# Patient Record
Sex: Female | Born: 1960 | Race: White | Hispanic: No | Marital: Single | Smoking: Current every day smoker
Health system: Southern US, Community
[De-identification: ages and names within clinical notes are randomized; demographics above are authoritative.]

## PROBLEM LIST (undated history)

## (undated) DIAGNOSIS — F32A Depression, unspecified: Secondary | ICD-10-CM

## (undated) DIAGNOSIS — E119 Type 2 diabetes mellitus without complications: Secondary | ICD-10-CM

## (undated) DIAGNOSIS — B1920 Unspecified viral hepatitis C without hepatic coma: Secondary | ICD-10-CM

## (undated) DIAGNOSIS — K219 Gastro-esophageal reflux disease without esophagitis: Secondary | ICD-10-CM

## (undated) DIAGNOSIS — F419 Anxiety disorder, unspecified: Secondary | ICD-10-CM

## (undated) DIAGNOSIS — H269 Unspecified cataract: Secondary | ICD-10-CM

## (undated) DIAGNOSIS — M1990 Unspecified osteoarthritis, unspecified site: Secondary | ICD-10-CM

## (undated) DIAGNOSIS — G8929 Other chronic pain: Secondary | ICD-10-CM

## (undated) DIAGNOSIS — I639 Cerebral infarction, unspecified: Secondary | ICD-10-CM

## (undated) DIAGNOSIS — G629 Polyneuropathy, unspecified: Secondary | ICD-10-CM

## (undated) DIAGNOSIS — F329 Major depressive disorder, single episode, unspecified: Secondary | ICD-10-CM

## (undated) HISTORY — DX: Gastro-esophageal reflux disease without esophagitis: K21.9

## (undated) HISTORY — PX: KNEE ARTHROSCOPY W/ ACL RECONSTRUCTION: SHX1858

## (undated) HISTORY — DX: Type 2 diabetes mellitus without complications: E11.9

## (undated) HISTORY — DX: Anxiety disorder, unspecified: F41.9

## (undated) HISTORY — DX: Polyneuropathy, unspecified: G62.9

## (undated) HISTORY — DX: Unspecified osteoarthritis, unspecified site: M19.90

## (undated) HISTORY — DX: Major depressive disorder, single episode, unspecified: F32.9

## (undated) HISTORY — DX: Cerebral infarction, unspecified: I63.9

## (undated) HISTORY — DX: Unspecified viral hepatitis C without hepatic coma: B19.20

## (undated) HISTORY — DX: Depression, unspecified: F32.A

## (undated) HISTORY — PX: TUBAL LIGATION: SHX77

## (undated) HISTORY — DX: Unspecified cataract: H26.9

---

## 1998-03-08 ENCOUNTER — Encounter: Payer: Self-pay | Admitting: Emergency Medicine

## 1998-03-08 ENCOUNTER — Emergency Department (HOSPITAL_COMMUNITY): Admission: EM | Admit: 1998-03-08 | Discharge: 1998-03-08 | Payer: Self-pay | Admitting: Emergency Medicine

## 1998-04-07 ENCOUNTER — Encounter: Admission: RE | Admit: 1998-04-07 | Discharge: 1998-07-06 | Payer: Self-pay | Admitting: Orthopedic Surgery

## 1998-04-10 ENCOUNTER — Other Ambulatory Visit: Admission: RE | Admit: 1998-04-10 | Discharge: 1998-04-10 | Payer: Self-pay | Admitting: *Deleted

## 1998-05-01 ENCOUNTER — Encounter: Admission: RE | Admit: 1998-05-01 | Discharge: 1998-05-01 | Payer: Self-pay | Admitting: Family Medicine

## 1998-05-02 ENCOUNTER — Encounter: Admission: RE | Admit: 1998-05-02 | Discharge: 1998-05-02 | Payer: Self-pay | Admitting: Family Medicine

## 1998-05-29 ENCOUNTER — Encounter: Admission: RE | Admit: 1998-05-29 | Discharge: 1998-05-29 | Payer: Self-pay | Admitting: Family Medicine

## 1998-08-14 ENCOUNTER — Emergency Department (HOSPITAL_COMMUNITY): Admission: EM | Admit: 1998-08-14 | Discharge: 1998-08-14 | Payer: Self-pay | Admitting: Emergency Medicine

## 1998-08-14 ENCOUNTER — Encounter: Payer: Self-pay | Admitting: Emergency Medicine

## 1998-10-15 ENCOUNTER — Emergency Department (HOSPITAL_COMMUNITY): Admission: EM | Admit: 1998-10-15 | Discharge: 1998-10-15 | Payer: Self-pay | Admitting: Emergency Medicine

## 2005-07-28 ENCOUNTER — Emergency Department (HOSPITAL_COMMUNITY): Admission: EM | Admit: 2005-07-28 | Discharge: 2005-07-28 | Payer: Self-pay | Admitting: Emergency Medicine

## 2005-08-25 ENCOUNTER — Ambulatory Visit (HOSPITAL_COMMUNITY): Admission: RE | Admit: 2005-08-25 | Discharge: 2005-08-25 | Payer: Self-pay | Admitting: Orthopedic Surgery

## 2005-10-04 ENCOUNTER — Emergency Department (HOSPITAL_COMMUNITY): Admission: EM | Admit: 2005-10-04 | Discharge: 2005-10-05 | Payer: Self-pay | Admitting: Emergency Medicine

## 2005-12-21 ENCOUNTER — Ambulatory Visit: Payer: Self-pay | Admitting: Gastroenterology

## 2006-01-31 ENCOUNTER — Encounter (INDEPENDENT_AMBULATORY_CARE_PROVIDER_SITE_OTHER): Payer: Self-pay | Admitting: *Deleted

## 2006-01-31 ENCOUNTER — Ambulatory Visit (HOSPITAL_COMMUNITY): Admission: RE | Admit: 2006-01-31 | Discharge: 2006-01-31 | Payer: Self-pay | Admitting: Gastroenterology

## 2006-03-03 ENCOUNTER — Ambulatory Visit: Payer: Self-pay | Admitting: Gastroenterology

## 2006-07-22 ENCOUNTER — Encounter (INDEPENDENT_AMBULATORY_CARE_PROVIDER_SITE_OTHER): Payer: Self-pay | Admitting: Specialist

## 2006-07-22 ENCOUNTER — Ambulatory Visit (HOSPITAL_COMMUNITY): Admission: RE | Admit: 2006-07-22 | Discharge: 2006-07-22 | Payer: Self-pay | Admitting: Gastroenterology

## 2006-08-05 ENCOUNTER — Encounter: Admission: RE | Admit: 2006-08-05 | Discharge: 2006-08-05 | Payer: Self-pay | Admitting: Gastroenterology

## 2006-08-10 ENCOUNTER — Emergency Department (HOSPITAL_COMMUNITY): Admission: EM | Admit: 2006-08-10 | Discharge: 2006-08-10 | Payer: Self-pay | Admitting: Emergency Medicine

## 2007-09-14 IMAGING — CR DG KNEE COMPLETE 4+V*L*
4 series · 4 of 4 positions shown · non-contrast
Comparison: None.

CLINICAL DATA: Knee pain.  
 LEFT KNEE - 4 VIEW:

[view not recorded (1 of 4)]
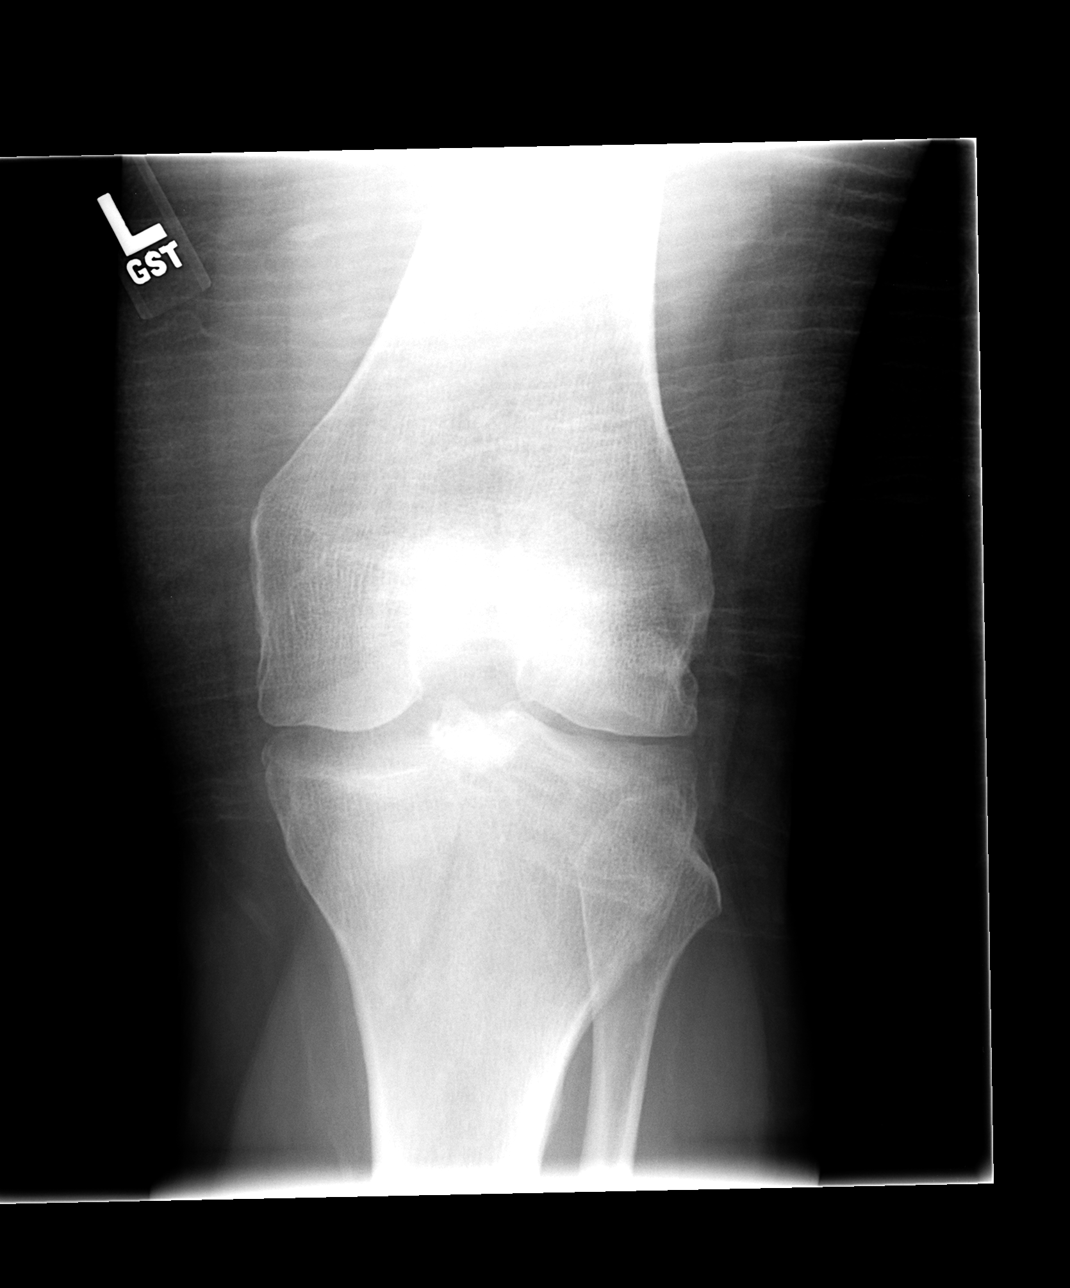

[view not recorded (2 of 4)]
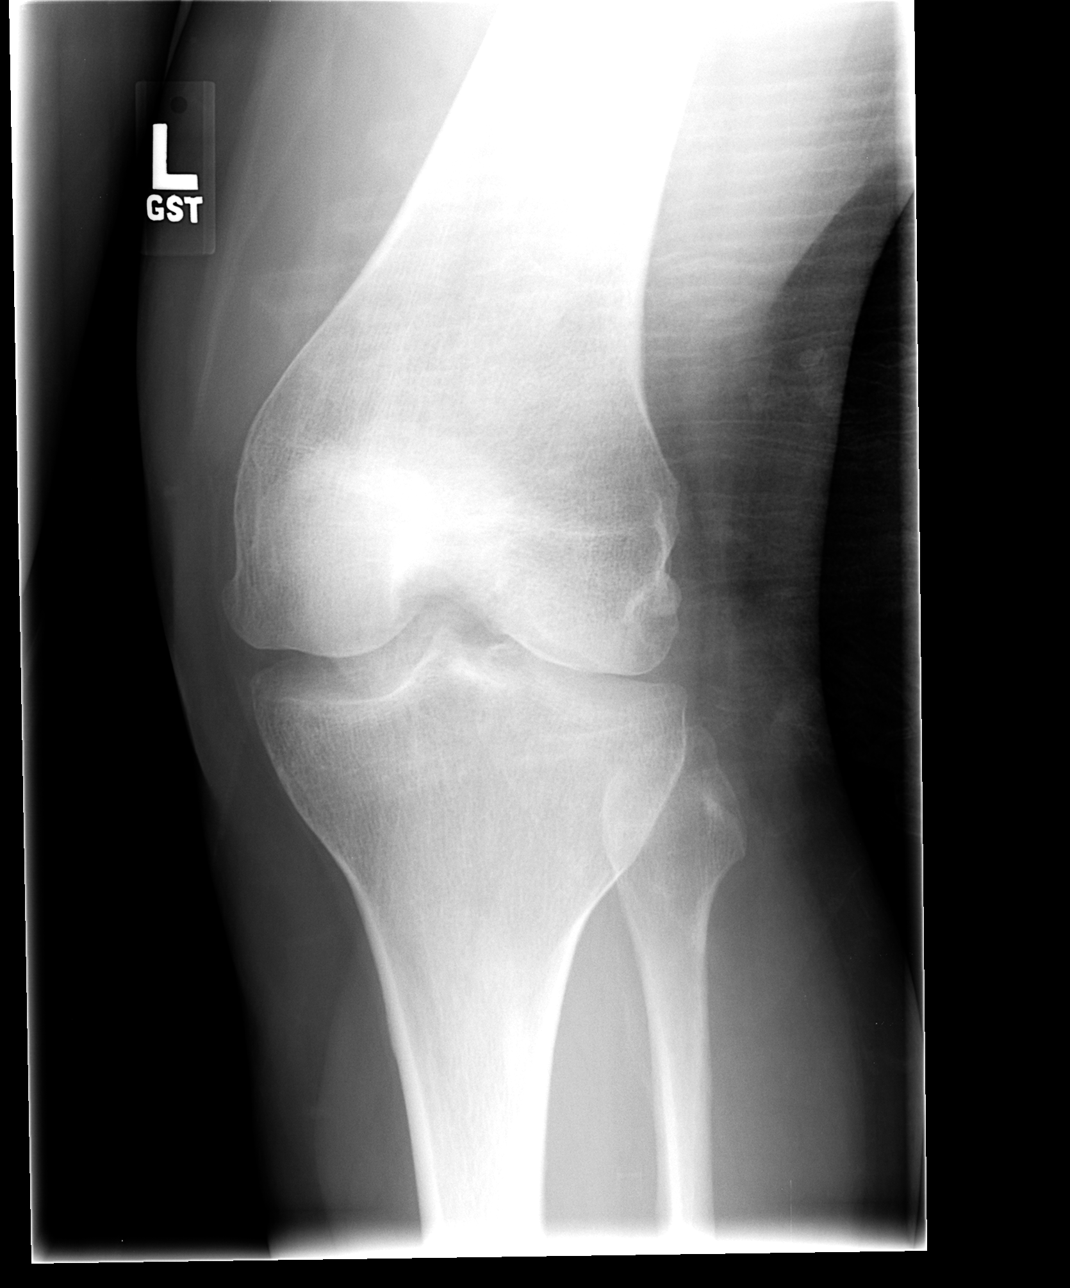

[view not recorded (3 of 4)]
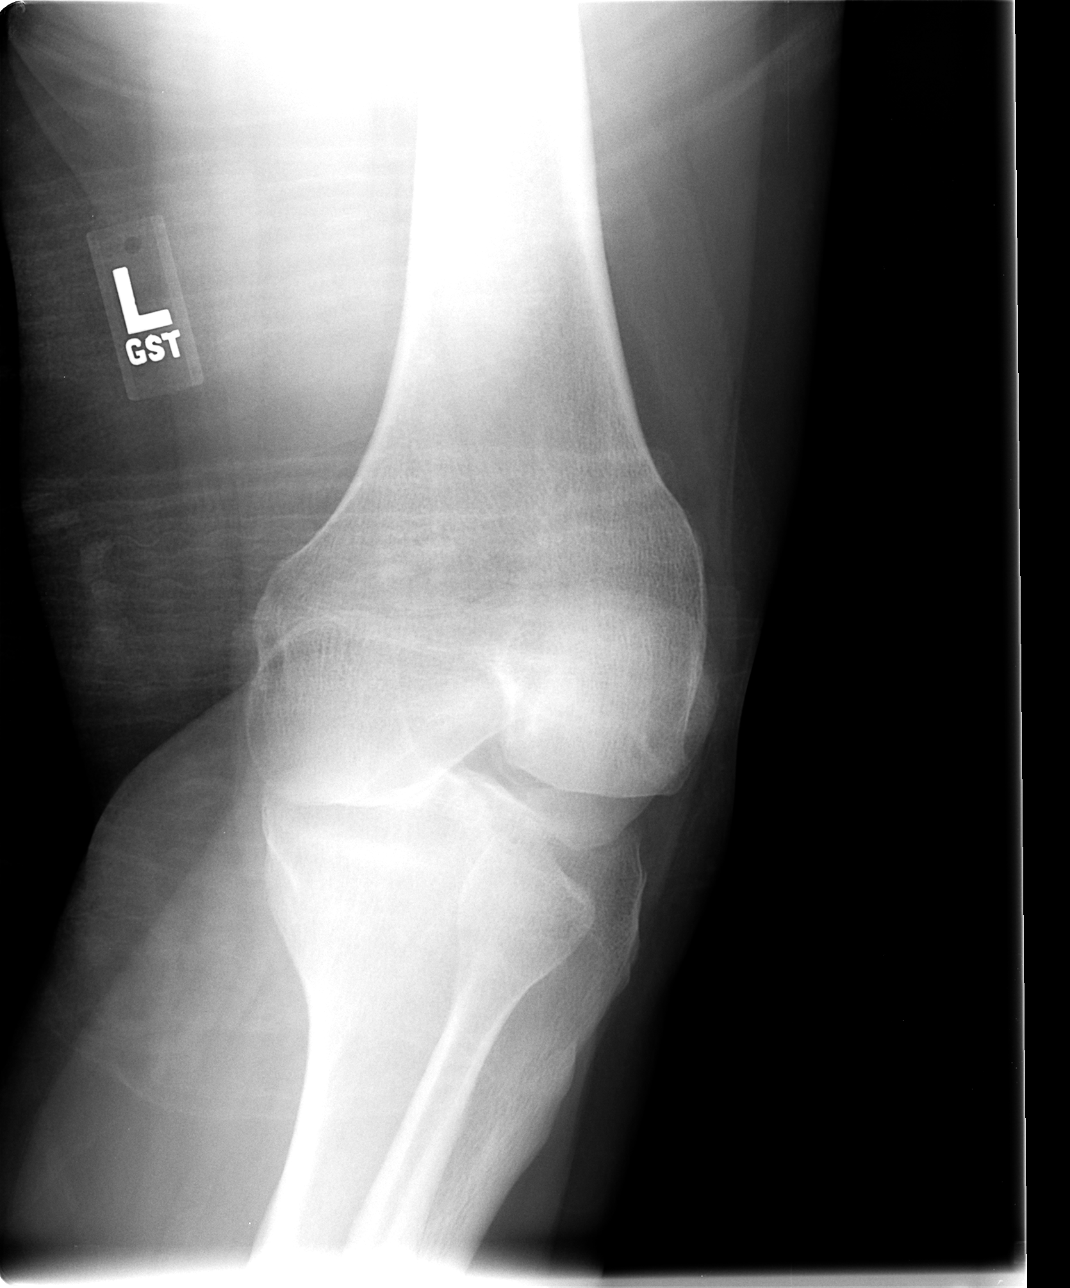

[view not recorded (4 of 4)]
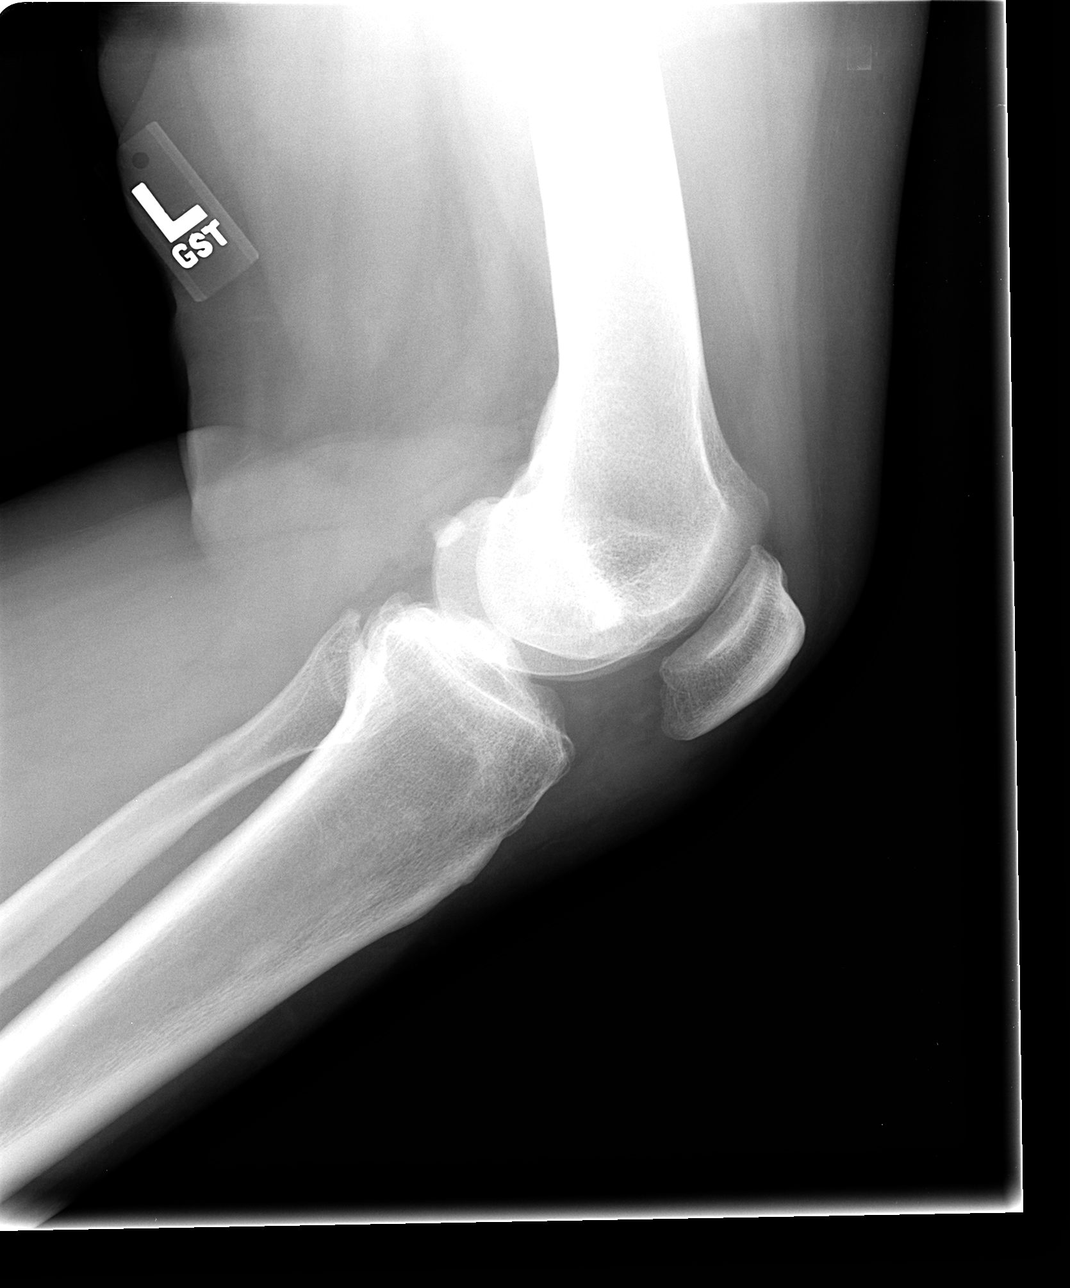

[4 of 4 positions shown; findings below may reference images not displayed]

FINDINGS: No joint effusion.  Quadriceps and patellar tendons are intact.  
 There are mild degenerative-type changes but no fractures or dislocations.
IMPRESSION: 1.  No acute findings. 
 2.  Mild degenerative changes.

## 2008-01-09 ENCOUNTER — Encounter: Admission: RE | Admit: 2008-01-09 | Discharge: 2008-03-05 | Payer: Self-pay | Admitting: Occupational Therapy

## 2009-08-24 ENCOUNTER — Emergency Department (HOSPITAL_COMMUNITY): Admission: EM | Admit: 2009-08-24 | Discharge: 2009-08-24 | Payer: Self-pay | Admitting: Emergency Medicine

## 2009-10-17 ENCOUNTER — Emergency Department (HOSPITAL_COMMUNITY): Admission: EM | Admit: 2009-10-17 | Discharge: 2009-10-18 | Payer: Self-pay | Admitting: Emergency Medicine

## 2010-09-01 LAB — POCT PREGNANCY, URINE: Preg Test, Ur: NEGATIVE

## 2010-09-01 LAB — POCT I-STAT, CHEM 8
BUN: 17 mg/dL (ref 6–23)
Calcium, Ion: 1.04 mmol/L — ABNORMAL LOW (ref 1.12–1.32)
Chloride: 98 mEq/L (ref 96–112)
Creatinine, Ser: 0.8 mg/dL (ref 0.4–1.2)
HCT: 48 % — ABNORMAL HIGH (ref 36.0–46.0)
Hemoglobin: 16.3 g/dL — ABNORMAL HIGH (ref 12.0–15.0)
Potassium: 2.7 mEq/L — CL (ref 3.5–5.1)
Sodium: 137 mEq/L (ref 135–145)
TCO2: 31 mmol/L (ref 0–100)

## 2010-09-01 LAB — URINALYSIS, ROUTINE W REFLEX MICROSCOPIC
Ketones, ur: NEGATIVE mg/dL
Protein, ur: NEGATIVE mg/dL
Urobilinogen, UA: 0.2 mg/dL (ref 0.0–1.0)

## 2010-10-30 NOTE — Op Note (Signed)
NAME:  Sara, Banks NO.:  [id number]   MEDICAL RECORD NO.:  [id number]          PATIENT TYPE:  AMB   LOCATION:  ENDO                         FACILITY:  [facility]   PHYSICIAN:  Anselmo Rod, M.D.  DATE OF BIRTH:  01/01/1961   DATE OF PROCEDURE:  07/22/2006  DATE OF DISCHARGE:  07/22/2006                               OPERATIVE REPORT   PROCEDURE PERFORMED:  Colonoscopy with snare polypectomy x1, cold  biopsies x4.   ENDOSCOPIST:  Anselmo Rod, M.D.   INSTRUMENT USED:  Pentax video colonoscope.   INDICATIONS FOR PROCEDURE:  A 50 year old female with a history of  change in bowel habits undergoing a colonoscopy to rule out colonic  polyps, masses, etcetera.   PREPROCEDURE PREPARATION:  Informed consent was procured from the  patient.  The patient was fasted for 4 hours prior to the procedure and  prepped with 20 OsmoPrep pills the night of, and 12 OsmoPrep pills the  morning of the procedure.  The risks and benefits of the procedure  including a 10% missed rate of cancer and polyp were discussed with the  patient as well.   PREPROCEDURE PHYSICAL:  The patient with stable vital signs.  NECK:  Supple.  CHEST:  Clear to auscultation.  Normal S1-S2.  ABDOMEN:  Soft  with normal bowel sounds.   DESCRIPTION OF PROCEDURE:  The patient was placed in the left lateral  decubitus position and sedated with 100 mcg of fentanyl and 10 mg of  Versed given intravenously in slow incremental doses.  Once the patient  was adequately sedated and maintained on low-flow oxygen and continuous  cardiac monitoring, the Pentax video colonoscope was advanced from the  rectum to the cecum.  A small sessile polyp was snared from the  rectosigmoid colon.  Four other polyps were biopsied from the left  colon; one from 50 cm, and three from the rectosigmoid colon.  There was  a large amount of residual stool in the colon.  Multiple washes were  done.  The patient has a somewhat  redundant colon. The patient's  position was changed from the left lateral supine and the right lateral  position with gentle application of abdominal pressure to reach the  cecal base.  Very small lesions could be missed as the prep was  relatively poor.  Retroflexion in the rectum revealed no abnormalities.  The patient tolerated the procedure well without complications.   IMPRESSION:  1. Multiple polyps removed from the left colon; and rectosigmoid colon      (see description above).  2. Significant amount of residual stool in the colon.  Multiple washes      done.  3. No evidence of diverticulosis.  4. No abnormalities noted on retroflexion of the rectum.   RECOMMENDATIONS:  1. Await pathology results.  2. Avoid all nonsteroidals including aspirin for the next 2 weeks.  3. Repeat colonoscopy depending on pathology results.  4. Continue a high fiber diet with liberal fluid intake.  5. Use Colace as need for problems with constipation and MiraLax if      the Colace  does not help.  6. Outpatient follow up in the next 2 weeks for further      recommendations.      Anselmo Rod, M.D.  Electronically Signed     JNM/MEDQ  D:  07/23/2006  T:  07/24/2006  Job:  [id number]   cc:   Lenoard Aden, M.D.

## 2011-06-02 ENCOUNTER — Encounter: Payer: Self-pay | Admitting: Gastroenterology

## 2011-06-21 ENCOUNTER — Encounter: Payer: Self-pay | Admitting: Gastroenterology

## 2011-06-21 ENCOUNTER — Ambulatory Visit (AMBULATORY_SURGERY_CENTER): Payer: BC Managed Care – PPO | Admitting: *Deleted

## 2011-06-21 VITALS — Ht 66.0 in | Wt 216.0 lb

## 2011-06-21 DIAGNOSIS — Z1211 Encounter for screening for malignant neoplasm of colon: Secondary | ICD-10-CM

## 2011-06-21 MED ORDER — PEG-KCL-NACL-NASULF-NA ASC-C 100 G PO SOLR: ORAL | Status: DC

## 2011-06-28 ENCOUNTER — Encounter: Payer: Self-pay | Admitting: Gastroenterology

## 2011-06-28 ENCOUNTER — Ambulatory Visit (AMBULATORY_SURGERY_CENTER): Payer: BC Managed Care – PPO | Admitting: Gastroenterology

## 2011-06-28 VITALS — BP 128/76 | HR 52 | Temp 96.8°F | Resp 13 | Ht 66.0 in | Wt 216.0 lb

## 2011-06-28 DIAGNOSIS — D126 Benign neoplasm of colon, unspecified: Secondary | ICD-10-CM

## 2011-06-28 DIAGNOSIS — D129 Benign neoplasm of anus and anal canal: Secondary | ICD-10-CM

## 2011-06-28 DIAGNOSIS — D128 Benign neoplasm of rectum: Secondary | ICD-10-CM

## 2011-06-28 DIAGNOSIS — Z1211 Encounter for screening for malignant neoplasm of colon: Secondary | ICD-10-CM

## 2011-06-28 MED ORDER — SODIUM CHLORIDE 0.9 % IV SOLN: 500.0000 mL | INTRAVENOUS | Status: DC

## 2011-06-28 NOTE — Op Note (Signed)
[facility] [address] [place], [place]  [address]  CLONOSCOPY PROCEDURE REPORT PATIENT:  Sara Banks, Sara Banks  MR#:  [id number] BIRTHDATE:  21-May-1961, 50 yrs. old  GENDER:  female ENDOSCOPIST:  Rachael Fee, MD REF. BY:  Jackie Plum, M.D. PROCEDURE DATE:  06/28/2011 PROCEDURE:  Colonoscopy with biopsy ASA CLASS:  Class II INDICATIONS:  "15 polyps removed in [place] 10 years ago." We don't have procedure report or path report from this test. Colonoscopy 07/2006 Dr. Charna Elizabeth found hyperplastic polyps only MEDICATIONS:   Fentanyl 75 mcg IV, These medications were titrated to patient response per physician's verbal order, Versed 7 mg IV  DESCRIPTION OF PROCEDURE:   After the risks benefits and alternatives of the procedure were thoroughly explained, informed consent was obtained.  Digital rectal exam was performed and revealed no rectal masses.   The LB [id number]-[id number] endoscope was introduced through the anus and advanced to the cecum, which was identified by both the appendix and ileocecal valve, without limitations.  The quality of the prep was good..  The instrument was then slowly withdrawn as the colon was fully examined. <<PROCEDUREIMAGES>> FINDINGS:  There were several small scattered hyperplastic appearing rectosigmoid polyps. These were sampled with biopsy and sent to pathology (jar 1) (see image3 and image4).  This was otherwise a normal examination of the colon (see image1, image2, and image5).   Retroflexed views in the rectum revealed no abnormalities. COMPLICATIONS:  None  ENDOSCOPIC IMPRESSION: 1) Hyperplastic appearing rectosigmoid polyps, biopsied 2) Otherwise normal examination  RECOMMENDATIONS: 1) If the polyp(s) removed today are proven to be adenomatous (pre-cancerous) polyps, you will need a repeat colonoscopy in 5 years. 2) You will receive a letter within 1-2 weeks with the results of your biopsy as well as final  recommendations. Please call my office if you have not received a letter after 3 weeks. 3) Dr. Christella Hartigan' office will work with you to track down records from your colonoscopy in [facility] 10 years ago (pathology and procedure report) in order to decide proper timing for your next colonoscopy.  _____________________________ Rachael Fee, MD  n. eSIGNED:   Rachael Fee at 06/28/2011 02:42 PM  Jaci Standard, [phone]

## 2011-06-28 NOTE — Patient Instructions (Addendum)
One of your biggest health concerns is your smoking.  This increases your risk for most cancers and serious cardiovascular diseases such as strokes, heart attacks.  You should try your best to stop.  If you need assistance, please contact your PCP or Smoking Cessation Class at [facility] [phone]) or [facility] Quit-Line (1-800-QUIT-NOW). FOLLOW DISCHARGE INSTRUCTIONS (BLUE AND GREEN SHEETS).

## 2011-06-28 NOTE — Progress Notes (Signed)
Patient did not have preoperative order for IV antibiotic SSI prophylaxis. (G8918)  Patient did not experience any of the following events: a burn prior to discharge; a fall within the facility; wrong site/side/patient/procedure/implant event; or a hospital transfer or hospital admission upon discharge from the facility. (G8907)  

## 2011-06-29 ENCOUNTER — Telehealth: Payer: Self-pay | Admitting: *Deleted

## 2011-06-29 ENCOUNTER — Telehealth: Payer: Self-pay

## 2011-06-29 NOTE — Telephone Encounter (Signed)

## 2011-06-29 NOTE — Telephone Encounter (Signed)
Dr Christella Hartigan I called the pt and she has no idea who or where she had her colon done.  What is the next step?

## 2011-06-30 ENCOUNTER — Other Ambulatory Visit: Payer: Self-pay | Admitting: Gastroenterology

## 2011-06-30 NOTE — Telephone Encounter (Signed)
We'll wait to see what pathology is from her colonoscopy this week.  IF hyperplastic, then next colonoscoyp in 10 years (will have only had hyperplastic polyps on pathology reports)

## 2011-07-02 ENCOUNTER — Encounter: Payer: Self-pay | Admitting: Gastroenterology

## 2012-06-14 HISTORY — PX: TRANSTHORACIC ECHOCARDIOGRAM: SHX275

## 2012-09-04 ENCOUNTER — Ambulatory Visit (INDEPENDENT_AMBULATORY_CARE_PROVIDER_SITE_OTHER): Payer: BC Managed Care – PPO | Admitting: Family Medicine

## 2012-09-04 VITALS — BP 128/84 | HR 86 | Temp 98.0°F | Resp 16 | Ht 66.0 in | Wt 201.0 lb

## 2012-09-04 DIAGNOSIS — N898 Other specified noninflammatory disorders of vagina: Secondary | ICD-10-CM

## 2012-09-04 DIAGNOSIS — Z113 Encounter for screening for infections with a predominantly sexual mode of transmission: Secondary | ICD-10-CM

## 2012-09-04 LAB — POCT WET PREP WITH KOH
Clue Cells Wet Prep HPF POC: NEGATIVE
RBC Wet Prep HPF POC: NEGATIVE
Trichomonas, UA: NEGATIVE
WBC Wet Prep HPF POC: NEGATIVE

## 2012-09-04 NOTE — Patient Instructions (Addendum)
We will have the rest of your labs back in the next day or two- I will call you with these results.  Let us know if there is anything else we can do to help!

## 2012-09-04 NOTE — Progress Notes (Signed)
[facility] [address], [place] [address] [phone]  Date:  09/04/2012   Name:  Sara Banks   DOB:  [birth year]   MRN:  [id number]  PCP:  Jackie Plum, MD    Chief Complaint: Exposure to STD   History of Present Illness:  Sara Banks is a 52 y.o. very pleasant female patient who presents with the following:  She is concerned about exposure to STI- she is afraid her husband has been unfaithful.  She last had intercourse with her husband about one month ago, prior to her concerns about his fidelity.  She has noted some watery discharge from her vagina, some odor "like body odor".  No pain.  No dysuria.   She is peri- menopausal.  Her last period was about 4 or 5 months ago.    There is no problem list on file for this patient.   Past Medical History  Diagnosis Date  . Arthritis   . Anxiety   . Depression     Past Surgical History  Procedure Laterality Date  . Knee arthroscopy w/ acl reconstruction      right knee x 3    History  Substance Use Topics  . Smoking status: Current Every Day Smoker -- 0.50 packs/day    Types: Cigarettes  . Smokeless tobacco: Never Used  . Alcohol Use: No     Comment: rarely once a year wine    Family History  Problem Relation Age of Onset  . Colon polyps Father     No Known Allergies  Medication list has been reviewed and updated.  Current Outpatient Prescriptions on File Prior to Visit  Medication Sig Dispense Refill  . citalopram (CELEXA) 10 MG tablet Take 1 tablet by mouth at bedtime.      Marland Kitchen etodolac (LODINE XL) 400 MG 24 hr tablet Take 1 tablet by mouth Daily.      . Melatonin 5 MG CAPS Take 1 capsule by mouth at bedtime as needed.         No current facility-administered medications on file prior to visit.    Review of Systems:  As per HPI- otherwise negative.   Physical Examination: Filed Vitals:   09/04/12 0921  BP: 128/84  Pulse: 86  Temp: 98 F (36.7 C)  Resp: 16    Filed Vitals:   09/04/12 0921  Height: 5\' 6"  (1.676 m)  Weight: 201 lb (91.173 kg)   Body mass index is 32.46 kg/(m^2). Ideal Body Weight: Weight in (lb) to have BMI = 25: 154.6  GEN: WDWN, NAD, Non-toxic, A & O x 3, obese HEENT: Atraumatic, Normocephalic. Neck supple. No masses, No LAD. Ears and Nose: No external deformity. CV: RRR, No M/G/R. No JVD. No thrill. No extra heart sounds. PULM: CTA B, no wheezes, crackles, rhonchi. No retractions. No resp. distress. No accessory muscle use. ABD: S, NT, ND, +BS. No rebound. No HSM. EXTR: No c/c/e NEURO Normal gait.  PSYCH: Normally interactive. Conversant. Not depressed or anxious appearing.  Calm demeanor.  GU: no abnormality noted, no discharge or odor, no CMT or adnexal tenderness or masses  Results for orders placed in visit on 09/04/12  POCT WET PREP WITH KOH      Result Value Range   Trichomonas, UA Negative     Clue Cells Wet Prep HPF POC neg     Epithelial Wet Prep HPF POC 1-3     Yeast Wet Prep HPF POC neg  Bacteria Wet Prep HPF POC 1+     RBC Wet Prep HPF POC neg     WBC Wet Prep HPF POC neg     KOH Prep POC Negative      Assessment and Plan: Routine screening for STI (sexually transmitted infection) - Plan: POCT Wet Prep with KOH, GC/chlamydia probe amp, genital, HIV antibody, Hepatitis B surface antigen, Hepatitis C antibody, RPR, HSV(herpes simplex vrs) 1+2 ab-IgG  Vaginal discharge - Plan: POCT Wet Prep with KOH, GC/chlamydia probe amp, genital  Possible STI exposure.  Will be in touch with her regarding the rest of her labs as soon as they are available.  Wet prep negative  Signed Abbe Amsterdam, MD

## 2012-09-06 ENCOUNTER — Telehealth: Payer: Self-pay | Admitting: Family Medicine

## 2012-09-06 DIAGNOSIS — Z1159 Encounter for screening for other viral diseases: Secondary | ICD-10-CM

## 2012-09-06 NOTE — Telephone Encounter (Signed)
Called and spoke with her- her Hep C screen is positive, she needs the confirmatory test.  She will come in for a lab draw as soon as she can.  If positive will send to hepatology.  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  She asked me NOT to send a copy of her labs to her home, but she will come in for her labs.  She is concerned about the copay needed at lab draw- we will work with her if needed with an account agreement

## 2012-09-12 ENCOUNTER — Other Ambulatory Visit (INDEPENDENT_AMBULATORY_CARE_PROVIDER_SITE_OTHER): Payer: BC Managed Care – PPO | Admitting: Family Medicine

## 2012-09-12 VITALS — BP 100/60 | HR 63 | Temp 98.5°F | Resp 12 | Ht 66.0 in | Wt 205.0 lb

## 2012-09-12 DIAGNOSIS — Z1159 Encounter for screening for other viral diseases: Secondary | ICD-10-CM

## 2012-09-14 ENCOUNTER — Telehealth: Payer: Self-pay | Admitting: Family Medicine

## 2012-09-14 DIAGNOSIS — B1920 Unspecified viral hepatitis C without hepatic coma: Secondary | ICD-10-CM

## 2012-09-14 NOTE — Telephone Encounter (Signed)
Called to communicate positive Hep C confirmatory lab.  Answered all questions and will refer to hep C clinic.

## 2012-09-21 ENCOUNTER — Telehealth: Payer: Self-pay | Admitting: Family Medicine

## 2012-09-21 DIAGNOSIS — B1920 Unspecified viral hepatitis C without hepatic coma: Secondary | ICD-10-CM

## 2012-09-21 NOTE — Telephone Encounter (Signed)
Called to find out about her hepatitis clinic appt- she has an appt in May.  Apologized for not ordering her LFTs  when she came for confirmatory testing- this should have been done.  Asked her to please come by clinic at her convenience for a lab visit only.  Will ask lab to please waive co-pay for this recollection.

## 2012-09-21 NOTE — Telephone Encounter (Signed)
Message copied by Pearline Cables on Thu Sep 21, 2012  5:13 PM ------      Message from: Sara Banks      Created: Thu Sep 14, 2012  4:29 PM       Unable to add. LFT"s are done on a SST and the HCV C Vrs RNA by PCR Qual is a critical frozen with a lavender top ------

## 2012-10-18 ENCOUNTER — Other Ambulatory Visit: Payer: Self-pay | Admitting: Internal Medicine

## 2012-10-18 DIAGNOSIS — R894 Abnormal immunological findings in specimens from other organs, systems and tissues: Secondary | ICD-10-CM

## 2012-10-23 ENCOUNTER — Ambulatory Visit
Admission: RE | Admit: 2012-10-23 | Discharge: 2012-10-23 | Disposition: A | Discharge status: Home / Self Care | Payer: BC Managed Care – PPO | Source: Ambulatory Visit | Attending: Internal Medicine | Admitting: Internal Medicine

## 2012-10-23 DIAGNOSIS — R894 Abnormal immunological findings in specimens from other organs, systems and tissues: Secondary | ICD-10-CM

## 2012-10-30 ENCOUNTER — Encounter: Payer: Self-pay | Admitting: Family Medicine

## 2013-05-11 ENCOUNTER — Encounter (HOSPITAL_COMMUNITY): Payer: Self-pay | Admitting: Emergency Medicine

## 2013-05-11 ENCOUNTER — Inpatient Hospital Stay (HOSPITAL_COMMUNITY)
Admission: EM | Admit: 2013-05-11 | Discharge: 2013-05-12 | DRG: 066 | Disposition: A | Discharge status: Home / Self Care | Payer: BC Managed Care – PPO | Attending: Family Medicine | Admitting: Family Medicine

## 2013-05-11 ENCOUNTER — Emergency Department (HOSPITAL_COMMUNITY): Payer: BC Managed Care – PPO

## 2013-05-11 ENCOUNTER — Emergency Department (HOSPITAL_COMMUNITY)
Admission: EM | Admit: 2013-05-11 | Discharge: 2013-05-11 | Disposition: A | Discharge status: Other Acute Inpatient Hospital | Payer: BC Managed Care – PPO | Source: Home / Self Care

## 2013-05-11 DIAGNOSIS — R202 Paresthesia of skin: Secondary | ICD-10-CM

## 2013-05-11 DIAGNOSIS — I639 Cerebral infarction, unspecified: Secondary | ICD-10-CM | POA: Diagnosis present

## 2013-05-11 DIAGNOSIS — Z720 Tobacco use: Secondary | ICD-10-CM | POA: Diagnosis present

## 2013-05-11 DIAGNOSIS — I6350 Cerebral infarction due to unspecified occlusion or stenosis of unspecified cerebral artery: Principal | ICD-10-CM | POA: Diagnosis present

## 2013-05-11 DIAGNOSIS — B182 Chronic viral hepatitis C: Secondary | ICD-10-CM | POA: Diagnosis present

## 2013-05-11 DIAGNOSIS — M545 Low back pain, unspecified: Secondary | ICD-10-CM | POA: Diagnosis present

## 2013-05-11 DIAGNOSIS — R079 Chest pain, unspecified: Secondary | ICD-10-CM | POA: Diagnosis present

## 2013-05-11 DIAGNOSIS — E66811 Obesity, class 1: Secondary | ICD-10-CM | POA: Diagnosis present

## 2013-05-11 DIAGNOSIS — M129 Arthropathy, unspecified: Secondary | ICD-10-CM | POA: Diagnosis present

## 2013-05-11 DIAGNOSIS — F17200 Nicotine dependence, unspecified, uncomplicated: Secondary | ICD-10-CM | POA: Diagnosis present

## 2013-05-11 DIAGNOSIS — R42 Dizziness and giddiness: Secondary | ICD-10-CM

## 2013-05-11 DIAGNOSIS — Z823 Family history of stroke: Secondary | ICD-10-CM

## 2013-05-11 DIAGNOSIS — H9319 Tinnitus, unspecified ear: Secondary | ICD-10-CM | POA: Diagnosis present

## 2013-05-11 DIAGNOSIS — R209 Unspecified disturbances of skin sensation: Secondary | ICD-10-CM | POA: Diagnosis present

## 2013-05-11 DIAGNOSIS — G8929 Other chronic pain: Secondary | ICD-10-CM | POA: Diagnosis present

## 2013-05-11 DIAGNOSIS — E669 Obesity, unspecified: Secondary | ICD-10-CM | POA: Diagnosis present

## 2013-05-11 HISTORY — DX: Other chronic pain: G89.29

## 2013-05-11 HISTORY — DX: Cerebral infarction, unspecified: I63.9

## 2013-05-11 LAB — CBC
HCT: 45.3 % (ref 36.0–46.0)
MCH: 30.6 pg (ref 26.0–34.0)
MCH: 31 pg (ref 26.0–34.0)
MCHC: 33.8 g/dL (ref 30.0–36.0)
MCHC: 34 g/dL (ref 30.0–36.0)
MCV: 90.6 fL (ref 78.0–100.0)
MCV: 91.1 fL (ref 78.0–100.0)
Platelets: 363 10*3/uL (ref 150–400)
RDW: 13.7 % (ref 11.5–15.5)
WBC: 6.2 10*3/uL (ref 4.0–10.5)
WBC: 8.8 10*3/uL (ref 4.0–10.5)

## 2013-05-11 LAB — TSH: TSH: 2.778 u[IU]/mL (ref 0.350–4.500)

## 2013-05-11 LAB — TROPONIN I: Troponin I: <0.3 ng/mL (ref <0.30)

## 2013-05-11 LAB — COMPREHENSIVE METABOLIC PANEL
ALT: 15 U/L (ref 0–35)
BUN: 9 mg/dL (ref 6–23)
CO2: 22 mEq/L (ref 19–32)
Calcium: 9 mg/dL (ref 8.4–10.5)
Creatinine, Ser: 0.66 mg/dL (ref 0.50–1.10)
GFR calc Af Amer: >90 mL/min (ref >90)
GFR calc non Af Amer: >90 mL/min (ref >90)
Glucose, Bld: 98 mg/dL (ref 70–99)
Sodium: 135 mEq/L (ref 135–145)
Total Bilirubin: 0.5 mg/dL (ref 0.3–1.2)
Total Protein: 7.2 g/dL (ref 6.0–8.3)

## 2013-05-11 LAB — URINALYSIS, ROUTINE W REFLEX MICROSCOPIC
Bilirubin Urine: NEGATIVE
Glucose, UA: NEGATIVE mg/dL
Ketones, ur: NEGATIVE mg/dL
Leukocytes, UA: NEGATIVE
Protein, ur: NEGATIVE mg/dL
Urobilinogen, UA: 0.2 mg/dL (ref 0.0–1.0)

## 2013-05-11 LAB — RAPID URINE DRUG SCREEN, HOSP PERFORMED
Amphetamines: NOT DETECTED
Barbiturates: NOT DETECTED
Benzodiazepines: NOT DETECTED

## 2013-05-11 LAB — PROTIME-INR
INR: 0.95 (ref 0.00–1.49)
Prothrombin Time: 12.5 seconds (ref 11.6–15.2)

## 2013-05-11 LAB — LIPID PANEL
HDL: 43 mg/dL (ref >39)
Total CHOL/HDL Ratio: 4.5 RATIO
Triglycerides: 222 mg/dL — ABNORMAL HIGH (ref <150)

## 2013-05-11 MED ORDER — GADOBENATE DIMEGLUMINE 529 MG/ML IV SOLN
19.0000 mL | Freq: Once | INTRAVENOUS | Status: AC | PRN
  Administered 2013-05-11: 19 mL via INTRAVENOUS

## 2013-05-11 MED ORDER — GABAPENTIN 100 MG PO CAPS
100.0000 mg | ORAL_CAPSULE | Freq: Two times a day (BID) | ORAL | Status: DC | PRN
  Administered 2013-05-11: 300 mg via ORAL
  Filled 2013-05-11: qty 3

## 2013-05-11 MED ORDER — OXYCODONE HCL 5 MG PO TABS
5.0000 mg | ORAL_TABLET | Freq: Four times a day (QID) | ORAL | Status: DC | PRN
  Administered 2013-05-11 – 2013-05-12 (×2): 5 mg via ORAL
  Filled 2013-05-11 (×2): qty 1

## 2013-05-11 MED ORDER — ASPIRIN 325 MG PO TABS
325.0000 mg | ORAL_TABLET | Freq: Every day | ORAL | Status: DC
  Administered 2013-05-11 – 2013-05-12 (×2): 325 mg via ORAL
  Filled 2013-05-11 (×3): qty 1

## 2013-05-11 MED ORDER — ASPIRIN 300 MG RE SUPP
300.0000 mg | Freq: Every day | RECTAL | Status: DC
  Filled 2013-05-11 (×2): qty 1

## 2013-05-11 MED ORDER — CYCLOBENZAPRINE HCL 10 MG PO TABS
10.0000 mg | ORAL_TABLET | Freq: Three times a day (TID) | ORAL | Status: DC | PRN
  Administered 2013-05-11: 10 mg via ORAL
  Filled 2013-05-11: qty 1

## 2013-05-11 MED ORDER — RIBAVIRIN 200 MG PO CAPS
600.0000 mg | ORAL_CAPSULE | Freq: Two times a day (BID) | ORAL | Status: DC
  Administered 2013-05-11: 600 mg via ORAL
  Filled 2013-05-11 (×3): qty 3

## 2013-05-11 MED ORDER — SOFOSBUVIR 400 MG PO TABS
400.0000 mg | ORAL_TABLET | Freq: Every day | ORAL | Status: DC
  Filled 2013-05-11 (×2): qty 1

## 2013-05-11 MED ORDER — SENNOSIDES-DOCUSATE SODIUM 8.6-50 MG PO TABS: 1.0000 | ORAL_TABLET | Freq: Every evening | ORAL | Status: DC | PRN

## 2013-05-11 MED ORDER — HEPARIN SODIUM (PORCINE) 5000 UNIT/ML IJ SOLN
5000.0000 [IU] | Freq: Three times a day (TID) | INTRAMUSCULAR | Status: DC
  Administered 2013-05-11 – 2013-05-12 (×4): 5000 [IU] via SUBCUTANEOUS
  Filled 2013-05-11 (×6): qty 1

## 2013-05-11 NOTE — ED Notes (Signed)
Pt sent from [facility] for dizziness with nausea and room spinning x 3 days; pt sent here for further eval; pt sts some decreased sensation on left side and has chronic pain on left side; pt ambulatory at present

## 2013-05-11 NOTE — ED Provider Notes (Signed)
Chief Complaint:   Chief Complaint  Patient presents with  . Dizziness    History of Present Illness:   Sara Banks is a 52 year old female with hepatitis C and no prior history of elevated blood pressure, stroke, or elevated cholesterol. She presents with a three-day history of whirling vertigo which is continuous, a full sensation in her left ear, she has trouble focusing her vision is blurry. When she stands she leans to the left and when she walks she is unsteady on her feet and staggers towards her left. She notes her breathing her left ear and diminished hearing in her left ear. She also complains of numbness and tingling of the entire left side of her body from her face on down to her leg. She's had some weakness of her left arm but no facial or leg weakness. This is more prominent yesterday and she doesn't notice it today. She denies headache, fever, chills, diplopia, or difficulty with speech. She's had no prior history of stroke. She smokes half a pack of cigarettes a day.  Review of Systems:  Other than noted above, the patient denies any of the following symptoms: Systemic:  No fever, chills, fatigue, photophobia, stiff neck. Eye:  No redness, eye pain, discharge, blurred vision, or diplopia. ENT:  No nasal congestion, rhinorrhea, sinus pressure or pain, sneezing, earache, or sore throat.  No jaw claudication. Neuro:  No paresthesias, loss of consciousness, seizure activity, muscle weakness, trouble with coordination or gait, trouble speaking or swallowing. Psych:  No depression, anxiety or trouble sleeping.  PMFSH:  Past medical history, family history, social history, meds, and allergies were reviewed.  She has a history of hepatitis C, anxiety, depression, and arthritis. She currently takes Neurontin, ribavirin, cyclobenzaprine, plus another hepatitis C drug.  Physical Exam:   Vital signs:  BP 146/92  Pulse 83  Temp(Src) 98.2 F (36.8 C) (Oral)  Resp 16  SpO2 97% General:   Alert and oriented.  In no distress. Eye:  Lids and conjunctivas normal.  PERRL,  Full EOMs.   ENT:  No cranial or facial tenderness to palpation.  TMs and canals clear.  Nasal mucosa was normal and uncongested without any drainage. No intra oral lesions, pharynx clear, mucous membranes moist, dentition normal. Neck:  Supple, full ROM, no tenderness to palpation.  No adenopathy or mass. No carotid bruit. Lungs: Clear to auscultation. Heart: Regular rhythm, no gallop or murmur. Neuro:  Alert and orented times 3.  Speech was clear, fluent, and appropriate.  Cranial nerves intact. No pronator drift, muscle strength normal. Finger to nose normal.  DTRs were 2+ and symmetrical.  She reports diminished sensation to light touch over the left side of the face, left arm, and left leg. Psych:  Normal affect.  Assessment:  The primary encounter diagnosis was CVA (cerebral infarction). Diagnoses of Paresthesias and Vertigo were also pertinent to this visit.  She has vertigo plus left body paresthesias and subjective left arm weakness although no objective weakness. CVA needs to be ruled out.  Plan:  The patient was transferred to the ED via shuttle in stable condition.  Medical Decision Making: 52 year old female presents with 3 days history of vertigo, left ear congestion, decreased hearing in left ear, ringing in left ear, blurred vision, ataxia, paresthesias of left face, arm and leg and weakness of left only.  On exam she is normal neurologically except for decreased sensation to light touch over entire left side of body.  I think she  may have had a stroke, but onset of symptoms has been days ago, so not a code stroke.  Will send by shuttle.          Reuben Likes, MD 05/11/13 1146

## 2013-05-11 NOTE — ED Provider Notes (Signed)
Medical screening examination/treatment/procedure(s) were performed by non-physician practitioner and as supervising physician I was immediately available for consultation/collaboration.  EKG Interpretation    Date/Time:    Ventricular Rate:    PR Interval:    QRS Duration:   QT Interval:    QTC Calculation:   R Axis:     Text Interpretation:                Layla Maw Maliha Outten, DO 05/11/13 1721

## 2013-05-11 NOTE — ED Provider Notes (Signed)
CSN: [id number]     Arrival date & time 05/11/13  1146 History   First MD Initiated Contact with Patient 05/11/13 1158     Chief Complaint  Patient presents with  . Dizziness  . Nausea   (Consider location/radiation/quality/duration/timing/severity/associated sxs/prior Treatment) Patient is a 52 y.o. female presenting with weakness.  Weakness This is a new problem. The current episode started in the past 7 days. The problem occurs constantly. The problem has been gradually improving. Associated symptoms include nausea, numbness, vertigo, a visual change and weakness. Nothing aggravates the symptoms. She has tried nothing for the symptoms.    URGENT CARE TRANSFER concern for stroke/TIA for 3 days  Pt to the ER with complaints of dizziness for three days. She has had symptoms of difficulty ambulating, left ear fullness/ringing, blurry vision, ataxia - leaning towards the left, left arm and leg  Paresthesias. The symptoms were most prominent yesterday and have resolved significantly, however when the dizziness persisted she went to the UC for further evaluation. Pt says, Dr. Lorenz Coaster is concerned I may have had a small stroke. Pt has PMH significant for Hep C. She is also a half pack per day smoker.      Past Medical History  Diagnosis Date  . Arthritis   . Anxiety   . Depression   . Hepatitis C dx 2014    [facility]  . Chronic pain    Past Surgical History  Procedure Laterality Date  . Knee arthroscopy w/ acl reconstruction      right knee x 3   Family History  Problem Relation Age of Onset  . Colon polyps Father    History  Substance Use Topics  . Smoking status: Current Every Day Smoker -- 0.50 packs/day    Types: Cigarettes  . Smokeless tobacco: Never Used  . Alcohol Use: No     Comment: rarely once a year wine   OB History   Grav Para Term Preterm Abortions TAB SAB Ect Mult Living                 Review of Systems  Gastrointestinal: Positive for nausea.   Neurological: Positive for vertigo, weakness and numbness.   Other than noted above, the patient denies any of the following symptoms:  Systemic: No fever, chills, fatigue, photophobia, stiff neck.  Eye: No redness, eye pain, discharge, blurred vision, or diplopia.  ENT: No nasal congestion, rhinorrhea, sinus pressure or pain, sneezing, earache, or sore throat. No jaw claudication.  Neuro: No paresthesias, loss of consciousness, seizure activity, muscle weakness, trouble with coordination or gait, trouble speaking or swallowing.  Psych: No depression, anxiety or trouble sleeping.  Allergies  Review of patient's allergies indicates no known allergies.  Home Medications   Current Outpatient Rx  Name  Route  Sig  Dispense  Refill  . cyclobenzaprine (FLEXERIL) 10 MG tablet   Oral   Take 10 mg by mouth 3 (three) times daily as needed for muscle spasms.         Marland Kitchen etodolac (LODINE XL) 400 MG 24 hr tablet   Oral   Take 1 tablet by mouth daily as needed (for pain).          Marland Kitchen gabapentin (NEURONTIN) 100 MG capsule   Oral   Take 100-300 mg by mouth 2 (two) times daily as needed (for pain/sleep).          . hydrocodone-ibuprofen (VICOPROFEN) 5-200 MG per tablet   Oral   Take  1 tablet by mouth every 8 (eight) hours as needed for pain.         . ribavirin (REBETOL) 200 MG capsule   Oral   Take 600 mg by mouth 2 (two) times daily.         . Sofosbuvir (SOVALDI) 400 MG TABS   Oral   Take 400 mg by mouth daily.          BP 126/70  Pulse 87  Temp(Src) 98.5 F (36.9 C) (Oral)  Resp 15  SpO2 96% Physical Exam  Nursing note and vitals reviewed. Constitutional: She is oriented to person, place, and time. She appears well-developed and well-nourished. No distress.  HENT:  Head: Normocephalic and atraumatic.  Eyes: Pupils are equal, round, and reactive to light.  Neck: Normal range of motion. Neck supple.  Cardiovascular: Normal rate and regular rhythm.   Pulmonary/Chest:  Effort normal.  Abdominal: Soft.  Neurological: She is alert and oriented to person, place, and time. She has normal strength. A sensory deficit (decreased sensation to light touch of left arm and face. ) is present. No cranial nerve deficit.  Finger to nose normal.    Skin: Skin is warm and dry.    ED Course  Procedures (including critical care time) Labs Review Labs Reviewed  URINE RAPID DRUG SCREEN (HOSP PERFORMED)  CBC  COMPREHENSIVE METABOLIC PANEL  PROTIME-INR  URINALYSIS, ROUTINE W REFLEX MICROSCOPIC   Imaging Review Dg Chest 2 View  05/11/2013   CLINICAL DATA:  Hypertension, dizziness, and nausea  EXAM: CHEST  2 VIEW  COMPARISON:  None.  FINDINGS: The lungs are adequately inflated. There are mildly increased linear interstitial markings in the mid lungs suggesting subsegmental atelectasis. There is no pleural effusion or pneumothorax. The cardiac silhouette is normal in size. The mediastinum is normal in width. There is no pleural effusion. The bony thorax exhibits no acute abnormality.  IMPRESSION: There is bilateral perihilar subsegmental atelectasis. There is no focal pneumonia nor evidence of CHF. If there are clinical findings that might suggest pulmonary embolism, CT angiography of the chest would be of value.   Electronically Signed   By: David  [place]   On: 05/11/2013 13:07   Ct Head Wo Contrast  05/11/2013   CLINICAL DATA:  Dizziness.  Nausea.  EXAM: CT HEAD WITHOUT CONTRAST  TECHNIQUE: Contiguous axial images were obtained from the base of the skull through the vertex without intravenous contrast.  COMPARISON:  CT 08/24/2009.  FINDINGS: No mass. No hydrocephalus. No hemorrhage. No acute bony abnormality identified. Visualized paranasal sinuses and mastoids are clear. Posterior fossa is unremarkable.  IMPRESSION: Negative exam.   Electronically Signed   By: Maisie Fus  Register   On: 05/11/2013 13:09    EKG Interpretation   None       MDM   1. CVA (cerebral infarction)      12:39pm Discussed with patient that I am concerned for stoke as well. Does not meet Code Stroke criteria due to length of symptoms. Discussed with patient the plan and labs/images I plan to order. She understands and is agreeable. She is currently stable.  2;19 pm - labs and images are unremarkable. MRI of brain ordered. Dr. Amada Jupiter to consult. Family Medicine to admit for further evaluation.  Inpatient, admit, family medicine  Dorthula Matas, PA-C 05/11/13 1420

## 2013-05-11 NOTE — ED Notes (Signed)
Pt returned from radiology.

## 2013-05-11 NOTE — Consult Note (Addendum)
Referring Physician: Dr Elesa Massed   Chief Complaint: Dizziness with left-sided neurosensory deficits  HPI: Sara Banks is a 52 y.o. female who developed dizziness on the evening of 05/09/2013. The patient developed symptoms sometime that evening; however, she is unable to say exactly what time this occurred. She also noted a fullness and ringing in her left ear. She states she felt dizzy whether she was lying, sitting, or standing. She also noted some numbness on the left side of her body although she denies weakness. Other symptoms included diaphoresis and chest heaviness. Over the past couple of days her symptoms have improved somewhat but have not resolved. The patient presented today for further evaluation. A CT scan of the head showed no acute changes. There is concern that the symptoms might represent a stroke and a neurology consult has been requested. The patient will be admitted for a full stroke workup including an MRI/MRA.  Date last known well: Date: 05/09/2013 Time last known well: Unable to determine tPA Given: No: - Late presentation - outside the window for treatment.  Past Medical History  Diagnosis Date  . Arthritis   . Anxiety   . Depression   . Hepatitis C dx 2014    [facility]  . Chronic pain     Past Surgical History  Procedure Laterality Date  . Knee arthroscopy w/ acl reconstruction      right knee x 3    Family History  Problem Relation Age of Onset  . Colon polyps Father    Social History:  reports that she has been smoking Cigarettes.  She has been smoking about 0.50 packs per day. She has never used smokeless tobacco. She reports that she does not drink alcohol or use illicit drugs.  Family history: The patient reports that both parents had strokes.  Allergies: No Known Allergies  Medications:  No current facility-administered medications for this encounter.   Current Outpatient Prescriptions  Medication Sig Dispense Refill  .  cyclobenzaprine (FLEXERIL) 10 MG tablet Take 10 mg by mouth 3 (three) times daily as needed for muscle spasms.      Marland Kitchen etodolac (LODINE XL) 400 MG 24 hr tablet Take 1 tablet by mouth daily as needed (for pain).       Marland Kitchen gabapentin (NEURONTIN) 100 MG capsule Take 100-300 mg by mouth 2 (two) times daily as needed (for pain/sleep).       . hydrocodone-ibuprofen (VICOPROFEN) 5-200 MG per tablet Take 1 tablet by mouth every 8 (eight) hours as needed for pain.      . ribavirin (REBETOL) 200 MG capsule Take 600 mg by mouth 2 (two) times daily.      . Sofosbuvir (SOVALDI) 400 MG TABS Take 400 mg by mouth daily.         ROS: History obtained from the patient  General ROS: negative for - chills, fever, night sweats, weight gain or weight loss. Positive for recent fatigue. Psychological ROS: negative for - behavioral disorder, hallucinations, memory difficulties, mood swings or suicidal ideation Ophthalmic ROS: negative for - blurry vision, double vision, eye pain or loss of vision ENT ROS: negative for - epistaxis, nasal discharge, oral lesions, sore throat, tinnitus or vertigo Allergy and Immunology ROS: negative for - hives or itchy/watery eyes Hematological and Lymphatic ROS: negative for - bleeding problems, bruising or swollen lymph nodes Endocrine ROS: negative for - galactorrhea, hair pattern changes, polydipsia/polyuria or temperature intolerance Respiratory ROS: negative for - cough, hemoptysis, shortness of breath or  wheezing Cardiovascular ROS: negative for - chest pain, dyspnea on exertion, edema or irregular heartbeat. Positive for chest heaviness when symptoms first occurred. Positive for diaphoresis with onset of symptoms. Gastrointestinal ROS: negative for - abdominal pain,hematemesis, nausea/vomiting or stool incontinence. Positive for diarrhea recently. Genito-Urinary ROS: negative for - dysuria, hematuria, incontinence or urinary frequency/urgency Musculoskeletal ROS: negative for -  joint swelling or muscular weakness. Previous left knee injury as well as coccyx injury. Neurological ROS: as noted in HPI Dermatological ROS: negative for rash and skin lesion changes   Physical Examination: Blood pressure 134/66, pulse 70, temperature 98.5 F (36.9 C), temperature source Oral, resp. rate 17, SpO2 100.00%.  General - 52 year old female in no acute distress. Heart - Regular rate and rhythm - no murmer Lungs - Clear to auscultation Abdomen - Soft - non tender Extremities - Distal pulses weak to absent - no edema Skin - Warm and dry   Neurologic Examination:  Mental Status: Alert, oriented, thought content appropriate.  Speech fluent without evidence of aphasia.  Able to follow 3 step commands without difficulty. Cranial Nerves: II: Discs not visualized; Visual fields grossly normal, pupils equal, round, reactive to light and accommodation III,IV, VI: ptosis not present, extra-ocular motions intact bilaterally V,VII: smile symmetric, facial light touch sensation normal bilaterally VIII: hearing normal bilaterally IX,X: gag reflex present XI: bilateral shoulder shrug XII: midline tongue extension Motor: Right : Upper extremity   5/5    Left:     Upper extremity   5/5  Lower extremity   5/5     Lower extremity   5/5 Tone and bulk:normal tone throughout; no atrophy noted Sensory: Decreased sensation to light touch left face and left upper extremity. Deep Tendon Reflexes: 2+ and symmetric throughout Plantars: Right: downgoing   Left: downgoing Cerebellar: normal finger-to-nose, normal rapid alternating movements and normal heel-to-shin test Gait: Not tested CV: pulses palpable throughout   She has a postive head thrust test to the right    Laboratory Studies:  Basic Metabolic Panel:  Recent Labs Lab 05/11/13 1232  NA 135  K 4.0  CL 103  CO2 22  GLUCOSE 98  BUN 9  CREATININE 0.66  CALCIUM 9.0    Liver Function Tests:  Recent Labs Lab  05/11/13 1232  AST 19  ALT 15  ALKPHOS 75  BILITOT 0.5  PROT 7.2  ALBUMIN 3.8   No results found for this basename: LIPASE, AMYLASE,  in the last 168 hours No results found for this basename: AMMONIA,  in the last 168 hours  CBC:  Recent Labs Lab 05/11/13 1232  WBC 6.2  HGB 14.6  HCT 43.2  MCV 90.6  PLT 297    Cardiac Enzymes: No results found for this basename: CKTOTAL, CKMB, CKMBINDEX, TROPONINI,  in the last 168 hours  BNP: No components found with this basename: POCBNP,   CBG: No results found for this basename: GLUCAP,  in the last 168 hours  Microbiology: Results for orders placed in visit on 09/04/12  GC/CHLAMYDIA PROBE AMP     Status: None   Collection Time    09/04/12  9:57 AM      Result Value Range Status   CT Probe RNA NEGATIVE   Final   GC Probe RNA NEGATIVE   Final   Comment:  Assay performed using the Gen-Probe APTIMA COMBO2 (R) Assay.           Acceptable specimen types for this assay include APTIMA Swabs (Unisex,     endocervical, urethral, or vaginal), first void urine, and ThinPrep     liquid based cytology samples.    Coagulation Studies:  Recent Labs  05/11/13 1232  LABPROT 12.5  INR 0.95    Urinalysis:  Recent Labs Lab 05/11/13 1328  COLORURINE YELLOW  LABSPEC 1.011  PHURINE 6.5  GLUCOSEU NEGATIVE  HGBUR NEGATIVE  BILIRUBINUR NEGATIVE  KETONESUR NEGATIVE  PROTEINUR NEGATIVE  UROBILINOGEN 0.2  NITRITE NEGATIVE  LEUKOCYTESUR NEGATIVE    Lipid Panel: No results found for this basename: chol, trig, hdl, cholhdl, vldl, ldlcalc    HgbA1C:  No results found for this basename: HGBA1C    Urine Drug Screen:     Component Value Date/Time   LABOPIA NONE DETECTED 05/11/2013 1328   COCAINSCRNUR NONE DETECTED 05/11/2013 1328   LABBENZ NONE DETECTED 05/11/2013 1328   AMPHETMU NONE DETECTED 05/11/2013 1328   THCU NONE  DETECTED 05/11/2013 1328   LABBARB NONE DETECTED 05/11/2013 1328    Alcohol Level: No results found for this basename: ETH,  in the last 168 hours  Other results: EKG: Not found  Imaging:  Dg Chest 2 View 05/11/2013    There is bilateral perihilar subsegmental atelectasis. There is no focal pneumonia nor evidence of CHF. If there are clinical findings that might suggest pulmonary embolism, CT angiography of the chest would be of value.      Ct Head Wo Contrast 05/11/2013    Negative exam.       Assessment: 52 y.o. female with onset of dizziness, ringing and fullness in her left ear, and left hemisensory deficit which came on gradually on the evening of 05/09/2013. Since then her symptoms have improved somewhat; however, they have not resolved. She presented today for further evaluation. A neurology consult was requested to rule out a possible CVA. The patient will be admitted for a full stroke workup.  Stroke Risk Factors - family history, smoking and obesity  Plan: 1. HgbA1c, fasting lipid panel 2. MRI, MRA  of the brain without contrast 3. PT consult, OT consult, Speech consult 4. Echocardiogram 5. Carotid dopplers 6. Prophylactic therapy-Antiplatelet med: Aspirin - dose 325 mg QD 7. Risk factor modification 8. Telemetry monitoring 9. Frequent neuro checks   Hassel Neth [facility] Pager [phone] 05/11/2013, 3:20 PM   I have seen and evaluated the patient. I have reviewed the above note and made appropriate changes. 52 year old female with acute onset of vertigo. Her exam including positive head thrust test and decreased hearing on left side are very suggestive of a peripheral process, but the hypesthesia on the left is very odd. Of note, she had not noticed this symptom until someone brought it to her attention suggesting that it may be an old deficit and confounding the picture here. I would repeat her MRI with thin DWI through the brainstem,  and if negative would treat this as labyrinthitis with a course of steroids.   It is possible that this represents a first attack of meniere's disease, but without multiple attacks, this is not a diagnosis to be made.   Ritta Slot, MD [facility] [phone]  If 7pm- 7am, please page neurology on call at [phone].

## 2013-05-11 NOTE — ED Notes (Signed)
C/o couple of days duration of dizzy, decreased sensation on one side for couple of days; NAD

## 2013-05-11 NOTE — ED Notes (Signed)
Attempted report. RN unavailable.

## 2013-05-11 NOTE — H&P (Signed)
[facility]MTS Attending Admission Note: Sara Pagan, MD I  have seen and examined this patient, reviewed their chart. I have discussed this patient with the resident. I agree with the resident's findings, assessment and care plan.  Briefly patient presented to the ER at her PCP request to r/o stroke for history of dizziness and left sided weakness and paresthesia that started 3 days ago. She denies any hx of fall,no LOC,her symptom was worse yesterday to the extent that she could not stand up by herself.No headache,no vision change,no facial dropping. She stated her symptom has improved a lot when compared to yesterday.  Past Medical History  Diagnosis Date  . Arthritis   . Anxiety   . Depression   . Hepatitis C dx 2014    [facility]  . Chronic pain    Filed Vitals:   05/11/13 1515 05/11/13 1642 05/11/13 1723 05/11/13 1930  BP: 140/71 136/116 140/77 137/86  Pulse: 71 79 85 106  Temp:   98.2 F (36.8 C) 97.9 F (36.6 C)  TempSrc:   Oral Oral  Resp: 14 18 20 18   SpO2: 98% 96% 98% 95%   Exam: Gen: Awake and alert, calm in bed not in distress. Neuro: Normal gait,no facial drooping,++DTR,cranial nerve grossly intact,no sensory loss,strength 5/5 grossly. Resp: Air entry equal B/L CV: S1 S2 normal, RRR,no murmurs. Abd: Benign. Ext: No edema.  A/P: 52 Y/O F with     Dizziness,left extremity weakness: R/O Stroke vs TIA vs vertigo with hx of tinnitus.     CT head done without acute finding.     Recommend stroke work up with MRI,carotid doppler, ECHO.     Neurology consult. PT/OP.     We will follow up with MRI report.       Chronic conditions: Hep C    Continue home regimen.

## 2013-05-11 NOTE — H&P (Signed)
Family Medicine [facility] Admission History and Physical Service Pager: [phone]  Patient name: Sara Banks Medical record number: [id number] Date of birth: Sep 11, 1960 Age: 52 y.o. Gender: female  Primary Care Provider: Jackie Plum, MD Consultants: Neurology Code Status: Full Code  Chief Complaint: Left sided weakness and dizziness  Assessment and Plan: Sara Banks is a 52 y.o. female presenting with left sided weakness and dizziness . PMH is significant for hepatitis C, tobacco use > 20 years, and strong family history of CVA. She is currently not having any symptoms of CVA and is stable.  # Left sided weakness: Patient's history suspicious for TIA vs CVA. Risk factors include strong family history of stroke (unsure if early age), obesity and smoking. Symptoms, for the most part, have resolved. Physical exam is not remarkable for stroke symptoms.  Admit to inpatient  Cardiac monitoring  Follow-up neurology recommendations  MRI/MRA head  Carotid doppler  Echocardiogram  Start aspirin  PT/OT consult  Frequent neuro checks  Risk factor modification: weightloss, smoking cessation, increased exercise  # Chest heaviness: complaint of chest heaviness with associated diaphoresis from conversation with neurology. Patient not currently complaining of chest pain. Could be anxiety vs ACS vs musculoskeletal. Has risk factors for CAD including obesity and smoking.  EKG today, repeat in AM  Cycle troponin x3  Risk stratification: TSH, lipid profile, A1C  # Hepatitis C  Continue home ribavirin and sofosbuvir  #Tobacco abuse: has a 20 pack-year history. Currently smoking 1/2 PPD.  Nicotine patch if patient requests  Encourage smoking cessation, will order counseling   # Chronic back pain  Will not resume home etodalac or hyrocodone-ibuprofen given increased risk of CV event  Continue gabapentin  Start oxycodone 5mg  q6hours PRN for  pain  FEN/GI: Heart healthy (passed bedside swallow); saline lock IV Prophylaxis: heparin subq  Disposition: Admit to inpatient  History of Present Illness: Sara Banks is a 52 y.o. female presenting with dizziness, nausea, constant left ear ringing and fullness, left sided weakness and numbness, and weakness that started two days ago. Her symptoms worsened yesterday, but have subsided today. Her left sided weakness causes her to lean to the left when she walks. She reports no recent falls due to her weakness, but has had to catch herself using a nearby wall. She has not had a history of this in the past. She states she does not take any medications for blood pressure or diabetes. Her ear ringing has been a chronic issue and has occurred for many years.  In the ED, she received a CT scan, which was negative for acute process. Neurology was consulted.  Review Of Systems: Per HPI with the following additions:  Otherwise 12 point review of systems was performed and was unremarkable.  There are no active problems to display for this patient.  Past Medical History: Past Medical History  Diagnosis Date  . Arthritis   . Anxiety   . Depression   . Hepatitis C dx 2014    [facility]  . Chronic pain    Past Surgical History: Past Surgical History  Procedure Laterality Date  . Knee arthroscopy w/ acl reconstruction      right knee x 3   Social History: History  Substance Use Topics  . Smoking status: Current Every Day Smoker -- 0.50 packs/day    Types: Cigarettes  . Smokeless tobacco: Never Used  . Alcohol Use: No     Comment: rarely once a year wine  Additional social history: smoking for   Please also refer to relevant sections of EMR.  Family History: Family History  Problem Relation Age of Onset  . Colon polyps Father    Stroke - mom (unknown age), dad (unknown age), aunts/uncles Prostate cancer - dad Cancer of cerebellum - mom  Allergies and  Medications: No Known Allergies No current facility-administered medications on file prior to encounter.   Current Outpatient Prescriptions on File Prior to Encounter  Medication Sig Dispense Refill  . cyclobenzaprine (FLEXERIL) 10 MG tablet Take 10 mg by mouth 3 (three) times daily as needed for muscle spasms.      Marland Kitchen etodolac (LODINE XL) 400 MG 24 hr tablet Take 1 tablet by mouth daily as needed (for pain).       Marland Kitchen gabapentin (NEURONTIN) 100 MG capsule Take 100-300 mg by mouth 2 (two) times daily as needed (for pain/sleep).         Objective: BP 123/69  Pulse 81  Temp(Src) 98.5 F (36.9 C) (Oral)  Resp 15  SpO2 98%  Exam: General: Obese female laying in stretcher in no acute distress with family at bedside and looks comfortable HEENT: PERRLA, EOMI, TMs non-erythematous and unremarkable, oral cavity moist Cardiovascular: RRR, no murmurs Respiratory: Clear to auscultation bilaterally, no wheezes or crackles Abdomen: Soft, obese, non-tender, no masses palpable Skin: No cyanosis Neuro: Alert and oriented x3; cranial nerves 2-11 intact, UE/LE 5/5 strength bilaterally, normal DTR (brachioradialis, biceps, patellar) bilaterally, Romberg negative, no dysdiadochokinesia, finger-to-nose normal, mildly decreased sensation when touching left foot compared to right.  Labs and Imaging: CBC BMET   Recent Labs Lab 05/11/13 1232  WBC 6.2  HGB 14.6  HCT 43.2  PLT 297    Recent Labs Lab 05/11/13 1232  NA 135  K 4.0  CL 103  CO2 22  BUN 9  CREATININE 0.66  GLUCOSE 98  CALCIUM 9.0     Dg Chest 2 View  05/11/2013   CLINICAL DATA:  Hypertension, dizziness, and nausea  EXAM: CHEST  2 VIEW  COMPARISON:  None.  FINDINGS: The lungs are adequately inflated. There are mildly increased linear interstitial markings in the mid lungs suggesting subsegmental atelectasis. There is no pleural effusion or pneumothorax. The cardiac silhouette is normal in size. The mediastinum is normal in width.  There is no pleural effusion. The bony thorax exhibits no acute abnormality.  IMPRESSION: There is bilateral perihilar subsegmental atelectasis. There is no focal pneumonia nor evidence of CHF. If there are clinical findings that might suggest pulmonary embolism, CT angiography of the chest would be of value.   Electronically Signed   By: David  [place]   On: 05/11/2013 13:07   Ct Head Wo Contrast  05/11/2013   CLINICAL DATA:  Dizziness.  Nausea.  EXAM: CT HEAD WITHOUT CONTRAST  TECHNIQUE: Contiguous axial images were obtained from the base of the skull through the vertex without intravenous contrast.  COMPARISON:  CT 08/24/2009.  FINDINGS: No mass. No hydrocephalus. No hemorrhage. No acute bony abnormality identified. Visualized paranasal sinuses and mastoids are clear. Posterior fossa is unremarkable.  IMPRESSION: Negative exam.   Electronically Signed   By: Maisie Fus  Register   On: 05/11/2013 13:09   Jacquelin Hawking, MD 05/11/2013, 2:10 PM PGY-1, [facility] Family Medicine FPTS Intern pager: [phone], text pages welcome  I have evaluated the patient and read and agree with the assessment and plan written above.   Si Raider Clinton Sawyer, MD, MBA 05/11/2013, 5:45 PM Family Medicine Resident, PGY-3

## 2013-05-12 ENCOUNTER — Inpatient Hospital Stay (HOSPITAL_COMMUNITY): Payer: BC Managed Care – PPO

## 2013-05-12 DIAGNOSIS — I059 Rheumatic mitral valve disease, unspecified: Secondary | ICD-10-CM

## 2013-05-12 DIAGNOSIS — I6350 Cerebral infarction due to unspecified occlusion or stenosis of unspecified cerebral artery: Secondary | ICD-10-CM

## 2013-05-12 DIAGNOSIS — G8929 Other chronic pain: Secondary | ICD-10-CM

## 2013-05-12 DIAGNOSIS — M549 Dorsalgia, unspecified: Secondary | ICD-10-CM

## 2013-05-12 LAB — LIPID PANEL
Cholesterol: 182 mg/dL (ref 0–200)
HDL: 39 mg/dL — ABNORMAL LOW (ref >39)
LDL Cholesterol: 101 mg/dL — ABNORMAL HIGH (ref 0–99)
Total CHOL/HDL Ratio: 4.7 RATIO
Triglycerides: 210 mg/dL — ABNORMAL HIGH (ref <150)

## 2013-05-12 LAB — HEMOGLOBIN A1C
Hgb A1c MFr Bld: 5.5 % (ref <5.7)
Mean Plasma Glucose: 111 mg/dL (ref <117)

## 2013-05-12 MED ORDER — ATORVASTATIN CALCIUM 80 MG PO TABS: 80.0000 mg | ORAL_TABLET | Freq: Every day | ORAL | Status: DC

## 2013-05-12 MED ORDER — ONDANSETRON HCL 4 MG PO TABS: 4.0000 mg | ORAL_TABLET | Freq: Three times a day (TID) | ORAL | Status: DC | PRN

## 2013-05-12 MED ORDER — ATORVASTATIN CALCIUM 80 MG PO TABS
80.0000 mg | ORAL_TABLET | Freq: Every day | ORAL | Status: DC
  Administered 2013-05-12: 80 mg via ORAL
  Filled 2013-05-12: qty 1

## 2013-05-12 MED ORDER — ASPIRIN EC 81 MG PO TBEC: 81.0000 mg | DELAYED_RELEASE_TABLET | Freq: Every day | ORAL | Status: DC

## 2013-05-12 MED ORDER — MECLIZINE HCL 25 MG PO TABS: 25.0000 mg | ORAL_TABLET | Freq: Three times a day (TID) | ORAL | Status: DC | PRN

## 2013-05-12 MED ORDER — MECLIZINE HCL 25 MG PO TABS
25.0000 mg | ORAL_TABLET | Freq: Three times a day (TID) | ORAL | Status: DC | PRN
  Administered 2013-05-12: 25 mg via ORAL
  Filled 2013-05-12: qty 1

## 2013-05-12 NOTE — Progress Notes (Signed)
Utilization review completed. Dion Sibal, RN, BSN. 

## 2013-05-12 NOTE — Progress Notes (Signed)
FMTS Attending Note  I personally saw and evaluated the patient. The plan of care was discussed with the resident team. I agree with the assessment and plan as documented by the resident.   Dizziness is improved today, continues to have subjective left upper and lower extremity numbness, no weakness  MRI - ?punctate lesion of the pons, residents to discuss with Neurology the significance of this finding. Awaiting Echo and Carotid Dopplers to complete stroke workup.   Donnella Sham MD

## 2013-05-12 NOTE — Discharge Summary (Signed)
Family Medicine Teaching [facility] Discharge Summary  Patient name: Sara Banks Medical record number: [id number] Date of birth: 03/03/1961 Age: 52 y.o. 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  Tinnitus Chronic Hepatitis C Tobacco Abuse  Chronic Low Back Pain  Disposition: Discharge to home  Discharge Condition: Statble  Discharge Exam:  BP 146/88  Pulse 68  Temp(Src) 97.9 F (36.6 C) (Oral)  Resp 18  Ht 5\' 6"  (1.676 m)  Wt 222 lb (100.699 kg)  BMI 35.85 kg/m2  SpO2 99% General: well appearing, non distressed pleasant  Cardiovascular: CV, RRR, no murmurs  Respiratory: CTA-B  Neuro: CN II-XII grossly intact, 5/5 strength of extremities, negative Babinski bilaterally   Brief Hospital Course:   52 year old F who presented after two days of new onset left sided weakness of and dizziness accompanied by numbness of left side. All symptoms had resolved except for the numbness by the time she reached the hospital. However, there was concern for possible TIA vs stroke vs peripheral process like Menire (since patient was experience tinnitus) or labyrinthitis. An MRI revealed an acute punctate area of decreased diffusion of the right lower pons concerning for a stroke. Therefore, she was started on anti-platelet therapy with ASA 81 mg and anti-lipid medication Lipitor 80 mg for stroke prevention. Carotid ultrasound negative for stenosis and no evidence of diabetes. Patient with tobacco abuse, so she was counseled on cessation. Overall, patent was stable upon discharge, but she may still benefit from vestibular rehab, because we could not rule  out a concomitant peripheral process.   The patient was treated for her chronic Hep C and chronic pain as well.   Issues for Follow Up:  Smoking Cessation Adherence to regimen of Lipitor and ASA Eval of tinnitus    Significant Procedures:  CT/MRI brain - see results below Carotid Ultrasound - stenosis 1-39% bilaterally of ICA Cardiac Echo - pending   Significant Labs and Imaging:   Recent Labs Lab 05/11/13 1232 05/11/13 2025  WBC 6.2 8.8  HGB 14.6 15.4*  HCT 43.2 45.3  PLT 297 363    Recent Labs Lab 05/11/13 1232 05/11/13 2025  NA 135  --   K 4.0  --   CL 103  --   CO2 22  --   GLUCOSE 98  --   BUN 9  --   CREATININE 0.66 0.90  CALCIUM 9.0  --   ALKPHOS 75  --   AST 19  --   ALT 15  --   ALBUMIN 3.8  --    Lipid Panel     Component Value Date/Time   CHOL 182 05/12/2013 0550   TRIG 210* 05/12/2013 0550   HDL 39* 05/12/2013 0550   CHOLHDL 4.7 05/12/2013 0550   VLDL 42* 05/12/2013 0550   LDLCALC 101* 05/12/2013 0550      Dg Chest 2 View  05/11/2013   CLINICAL DATA:  Hypertension, dizziness, and nausea  EXAM: CHEST  2 VIEW  COMPARISON:  None.  FINDINGS: The lungs are adequately inflated. There are mildly increased linear interstitial markings in the mid lungs suggesting subsegmental atelectasis. There is no pleural effusion or pneumothorax. The cardiac silhouette is normal in size. The mediastinum  is normal in width. There is no pleural effusion. The bony thorax exhibits no acute abnormality.  IMPRESSION: There is bilateral perihilar subsegmental atelectasis. There is no focal pneumonia nor evidence of CHF. If there are clinical findings that might suggest pulmonary embolism, CT angiography of the chest would be of value.   Electronically Signed   By: David  [place]   On: 05/11/2013 13:07   Ct Head Wo Contrast  05/11/2013   CLINICAL DATA:  Dizziness.  Nausea.  EXAM: CT HEAD WITHOUT CONTRAST  TECHNIQUE: Contiguous axial images were obtained from the base of  the skull through the vertex without intravenous contrast.  COMPARISON:  CT 08/24/2009.  FINDINGS: No mass. No hydrocephalus. No hemorrhage. No acute bony abnormality identified. Visualized paranasal sinuses and mastoids are clear. Posterior fossa is unremarkable.  IMPRESSION: Negative exam.   Electronically Signed   By: Maisie Fus  Register   On: 05/11/2013 13:09   Mr Sara Banks Wo Contrast  05/11/2013   CLINICAL DATA:  52 year old female acute onset dizziness nausea and vomiting. Diaphoresis. Off balance. Initial encounter.  EXAM: MRI HEAD WITHOUT AND WITH CONTRAST  TECHNIQUE: Multiplanar, multiecho pulse sequences of the brain and surrounding structures were obtained without and with intravenous contrast.  CONTRAST:  19mL MULTIHANCE GADOBENATE DIMEGLUMINE 529 MG/ML IV SOLN  COMPARISON:  Head CTs 05/11/2013 and earlier.  FINDINGS: Normal cerebral volume. No restricted diffusion to suggest acute infarction. No midline shift, mass effect, evidence of mass lesion, ventriculomegaly, extra-axial collection or acute intracranial hemorrhage. Cervicomedullary junction and pituitary are within normal limits. Negative visualized cervical spine. Major intracranial vascular flow voids are preserved.  Wallace Cullens and white matter signal is within normal limits for age throughout the brain. No abnormal enhancement identified. Visible internal auditory structures appear normal. Mastoids are clear.  Visualized orbit soft tissues are within normal limits. Normal paranasal sinuses. Visualized scalp soft tissues are within normal limits. Visualized bone marrow signal is within normal limits.  IMPRESSION: Normal for age MRI appearance of the brain.   Electronically Signed   By: Augusto Gamble M.D.   On: 05/11/2013 16:31   Mr Brain Ltd W/o Cm  05/12/2013   CLINICAL DATA:  52 year old female with acute onset dizziness, nausea, vomiting. Diaphoresis. Initial encounter.  EXAM: LIMITED MRI HEAD WITHOUT CONTRAST  TECHNIQUE: Multiplanar, multiecho  pulse sequences of the brain and surrounding structures were obtained without intravenous contrast.  COMPARISON:  Brain MRI 05/11/2013.  FINDINGS: Limited follow-up exam requested by neurology. Today's exam consists of axial and coronal diffusion weighted imaging only.  Center diffusion-weighted imaging performed, with subsequent mild increase and image noise.  On axial images, the only areas suspicious for restricted diffusion is a punctate focus in the dorsal lower pons on the right of midline (series 3 and series 300, image 11). However, this focus is not correlated on today's coronal diffusion, and not identified on yesterday's comparison.  No other suspicious diffusion signal on axial or coronal acquired data sets.  No intracranial mass effect or ventriculomegaly. Overall cerebral volume and morphology appear stable.  IMPRESSION: Questionable solitary, punctate focus of restricted diffusion in the dorsal lower pons on the right, seen only on the axial series of today's diffusion-weighted imaging, and not present on the study from yesterday.   Electronically Signed   By: Augusto Gamble M.D.   On: 05/12/2013 09:46     Results/Tests Pending at Time of Discharge:  Cardiac Echo  Discharge Medications:    Medication List    STOP taking these medications  etodolac 400 MG 24 hr tablet  Commonly known as:  LODINE XL     hydrocodone-ibuprofen 5-200 MG per tablet  Commonly known as:  VICOPROFEN      TAKE these medications       aspirin EC 81 MG tablet  Take 1 tablet (81 mg total) by mouth daily.     atorvastatin 80 MG tablet  Commonly known as:  LIPITOR  Take 1 tablet (80 mg total) by mouth daily at 6 PM.     cyclobenzaprine 10 MG tablet  Commonly known as:  FLEXERIL  Take 10 mg by mouth 3 (three) times daily as needed for muscle spasms.     gabapentin 100 MG capsule  Commonly known as:  NEURONTIN  Take 100-300 mg by mouth 2 (two) times daily as needed (for pain/sleep).     meclizine  25 MG tablet  Commonly known as:  ANTIVERT  Take 1 tablet (25 mg total) by mouth 3 (three) times daily as needed for dizziness.     ondansetron 4 MG tablet  Commonly known as:  ZOFRAN  Take 1 tablet (4 mg total) by mouth every 8 (eight) hours as needed for nausea or vomiting.     ribavirin 200 MG capsule  Commonly known as:  REBETOL  Take 600 mg by mouth 2 (two) times daily.     SOVALDI 400 MG Tabs  Generic drug:  Sofosbuvir  Take 400 mg by mouth daily.        Discharge Instructions: Please refer to Patient Instructions section of EMR for full details.  Patient was counseled important signs and symptoms that should prompt return to medical care, changes in medications, dietary instructions, activity restrictions, and follow up appointments.   Follow-Up Appointments:     Follow-up Information   Schedule an appointment as soon as possible for a visit with OSEI-BONSU,GEORGE, MD.   Specialty:  Internal Medicine   Contact information:   [address] [place] [address] [phone]       Si Raider Clinton Sawyer, MD, MBA 05/13/2013, 10:12 AM Family Medicine Resident, PGY-3

## 2013-05-12 NOTE — Progress Notes (Signed)
  Echocardiogram 2D Echocardiogram has been performed.  Sara Banks FRANCES 05/12/2013, 4:54 PM

## 2013-05-12 NOTE — Evaluation (Signed)
Occupational Therapy Evaluation Patient Details Name: Sara Banks MRN: [id number] DOB: 11/13/1960 Today's Date: 05/12/2013 Time: 3086-5784 OT Time Calculation (min): 15 min  OT Assessment / Plan / Recommendation History of present illness pt presents with 2 days of L ear ringing, fullness, dizziness, and L sided numbness and weakness.     Clinical Impression   Pt admitted with above.  Presenting with decreased smoothness of saccadic movements and difficulty tracking in upper Left quadrant.  C/o dizziness when initially standing but reports its last briefly and then she is able to continue ambulating.  Eval session limited due to arrival of transport to take pt to MRI. Will continue to follow acutely in order to address below problem list.     OT Assessment  Patient needs continued OT Services    Follow Up Recommendations  Outpatient OT;Supervision - Intermittent    Barriers to Discharge      Equipment Recommendations   (TBD)    Recommendations for Other Services    Frequency  Min 2X/week    Precautions / Restrictions Precautions Precautions: Fall Restrictions Weight Bearing Restrictions: No   Pertinent Vitals/Pain See vitals    ADL  Toilet Transfer: Simulated;Supervision/safety Toilet Transfer Method: Sit to stand Toilet Transfer Equipment:  (bed ambulating to w/c) Transfers/Ambulation Related to ADLs: supervision ambulating in room ADL Comments: Educated pt on performing saccades several times daily.    OT Diagnosis: Paresis;Disturbance of vision  OT Problem List: Decreased strength;Impaired sensation;Impaired vision/perception OT Treatment Interventions: Self-care/ADL training;Patient/family education;Therapeutic activities;Visual/perceptual remediation/compensation   OT Goals(Current goals can be found in the care plan section) Acute Rehab OT Goals Patient Stated Goal: Back to normal OT Goal Formulation: With patient Time For Goal Achievement:  05/19/13 Potential to Achieve Goals: Good  Visit Information  Last OT Received On: 05/12/13 Assistance Needed: +1 History of Present Illness: pt presents with 2 days of L ear ringing, fullness, dizziness, and L sided numbness and weakness.         Prior Functioning     Home Living Family/patient expects to be discharged to:: Private residence Living Arrangements: Children Available Help at Discharge: Family;Available PRN/intermittently Type of Home: House Home Access: Stairs to enter [employer] of Steps: couple Entrance Stairs-Rails: Right;Left Home Layout: One level Home Equipment: None Prior Function Level of Independence: Independent Communication Communication: No difficulties         Vision/Perception Vision - History Baseline Vision: No visual deficits Patient Visual Report:  (sees a "trail" when tracking objects) Vision - Assessment Vision Assessment: Vision tested Tracking/Visual Pursuits: Decreased smoothness of horizontal tracking;Decreased smoothness of eye movement to LEFT superior field Saccades: Decreased speed of saccadic movement;Additional eye shifts occurred during testing Visual Fields: No apparent deficits Additional Comments: Pt reports discomfort on sides of eyes when tracking and when performing saccades.  Able to correctly locate and read several items off of menu.   Cognition  Cognition Arousal/Alertness: Awake/alert Behavior During Therapy: WFL for tasks assessed/performed Overall Cognitive Status: Within Functional Limits for tasks assessed    Extremity/Trunk Assessment Upper Extremity Assessment Upper Extremity Assessment: LUE deficits/detail LUE Deficits / Details: 4/5 throughout LUE Sensation: decreased light touch LUE Coordination: decreased fine motor Lower Extremity Assessment Lower Extremity Assessment: LLE deficits/detail LLE Deficits / Details: Generally a little weak, 4/5.  Diminished soft touch sensation.   LLE  Sensation: decreased light touch LLE Coordination: decreased fine motor Cervical / Trunk Assessment Cervical / Trunk Assessment: Normal     Mobility Bed Mobility Bed Mobility: Not assessed (pt  sitting up EOB)  Transfers Transfers: Sit to Stand;Stand to Sit Sit to Stand: 6: Modified independent (Device/Increase time);From bed Stand to Sit: 6: Modified independent (Device/Increase time) (to w/c) Details for Transfer Assistance: Utilizes UEs to complete Mod I.       Exercise     Balance    End of Session OT - End of Session Activity Tolerance: Patient tolerated treatment well Patient left:  (in w/c with transport for MRI) Nurse Communication: Mobility status  GO   05/12/2013 Cipriano Mile OTR/L Pager [phone] Office [phone]   Cipriano Mile 05/12/2013, 9:14 AM

## 2013-05-12 NOTE — Progress Notes (Signed)
VASCULAR LAB PRELIMINARY  PRELIMINARY  PRELIMINARY  PRELIMINARY  Carotid Dopplers completed.    Preliminary report:  There is 1-39% ICA stenosis.  Vertebral artery flow is antegrade.  Kastin Cerda, RVT 05/12/2013, 10:47 AM

## 2013-05-12 NOTE — Progress Notes (Addendum)
Subjective: Feels better, still some dizziness  Exam: Filed Vitals:   05/12/13 0826  BP: 112/67  Pulse: 94  Temp: 97.9 F (36.6 C)  Resp: 18   Gen: In bed, NAD MS: Awake, alert, oriented CN: He does equal round and reactive, extra to movements intact. Her head thrust test is significantly improved from yesterday, but does still have slight correcting saccades. Is mild nystagmus on rightward gaze Motor: 5/5 throughout Sensory: Decreased on left side  Impression: 52 year old female with acute onset of vertigo. Her exam including positive head thrust test and decreased hearing on left side are very suggestive of a peripheral process, but the hypesthesia on the left is very odd. Of note, she had not noticed this symptom until someone brought it to her attention suggesting that it may be an old deficit and confounding the picture here. I would repeat her MRI with thin DWI through the brainstem, and if negative would treat this as labyrinthitis with a course of steroids.    Recommendations: 1) MRI with thin cuts through brainstem, if positive then full stroke workup otherwise would treat as labyrinthitis 2) if MRI is negative, given that she is improving I would favor a short course of steroids. Would consider prednisone 60 mg per day x1 week.  Ritta Slot, MD [facility] [phone]  If 7pm- 7am, please page neurology on call at [phone].   MRI shows small area on the right pons which could be associated with numbness. I would favor treating this as infarct.   Carotid dopplers, Echo LDL > 100, treat with statin A1c pending Antiplatelet therapy with ASA 81mg  qday.    Ritta Slot, MD [facility] [phone]  If 7pm- 7am, please page neurology on call at [phone].

## 2013-05-12 NOTE — Evaluation (Signed)
Physical Therapy Evaluation Patient Details Name: Sara Banks MRN: [id number] DOB: 1960/10/30 Today's Date: 05/12/2013 Time: 7829-5621 PT Time Calculation (min): 28 min  PT Assessment / Plan / Recommendation History of Present Illness  pt rpesents with 2 days of L ear ringing, fullness, dizziness, and L sided numbness and weakness.    Clinical Impression  Pt would benefit from OPPT for vestibular and balance f/u.  Pt with difficulty tracking in upper L quadrant and during smooth pursuits to L side.  Slight nystagmus noted during these moments.  Pt indicates decreased hearing when symptoms started in L ear, which has now gotten better.  Will continue to follow.      PT Assessment  Patient needs continued PT services    Follow Up Recommendations  Outpatient PT (Vestibular)    Does the patient have the potential to tolerate intense rehabilitation      Barriers to Discharge        Equipment Recommendations  None recommended by PT    Recommendations for Other Services     Frequency Min 4X/week    Precautions / Restrictions Precautions Precautions: Fall Restrictions Weight Bearing Restrictions: No   Pertinent Vitals/Pain Stiff back and L knee.        Mobility  Bed Mobility Bed Mobility: Supine to Sit;Sitting - Scoot to Edge of Bed Supine to Sit: 6: Modified independent (Device/Increase time) Sitting - Scoot to Edge of Bed: 6: Modified independent (Device/Increase time) Details for Bed Mobility Assistance: pt moves slowly and indicates stiff back from sleeping in hospital bed.   Transfers Transfers: Sit to Stand;Stand to Sit Sit to Stand: 6: Modified independent (Device/Increase time);With upper extremity assist;From bed Stand to Sit: 6: Modified independent (Device/Increase time);With upper extremity assist;To bed Details for Transfer Assistance: Utilizes UEs to complete Mod I.   Ambulation/Gait Ambulation/Gait Assistance: 4: Min guard Ambulation Distance (Feet):  200 Feet Assistive device: None Ambulation/Gait Assistance Details: pt with slight decrease DF on L LE.  pt had one episode of dizziness requiring pt to stop and hold on to counter, but no LOB or nystagmus.   Gait Pattern: Step-through pattern;Decreased stride length;Decreased dorsiflexion - left Stairs: No Wheelchair Mobility Wheelchair Mobility: No Modified Rankin (Stroke Patients Only) Pre-Morbid Rankin Score: No symptoms Modified Rankin: Moderately severe disability    Exercises     PT Diagnosis: Difficulty walking  PT Problem List: Decreased strength;Decreased activity tolerance;Decreased balance;Decreased mobility;Impaired sensation PT Treatment Interventions: DME instruction;Gait training;Stair training;Functional mobility training;Therapeutic activities;Therapeutic exercise;Balance training;Neuromuscular re-education;Patient/family education     PT Goals(Current goals can be found in the care plan section) Acute Rehab PT Goals Patient Stated Goal: Back to normal PT Goal Formulation: With patient Time For Goal Achievement: 05/26/13 Potential to Achieve Goals: Good  Visit Information  Last PT Received On: 05/12/13 Assistance Needed: +1 History of Present Illness: pt rpesents with 2 days of L ear ringing, fullness, dizziness, and L sided numbness and weakness.         Prior Functioning  Home Living Family/patient expects to be discharged to:: Private residence Living Arrangements: Children Available Help at Discharge: Family;Available PRN/intermittently Type of Home: House Home Access: Stairs to enter [employer] of Steps: couple Entrance Stairs-Rails: Right;Left Home Layout: One level Home Equipment: None Prior Function Level of Independence: Independent Communication Communication: No difficulties    Cognition  Cognition Arousal/Alertness: Awake/alert Behavior During Therapy: WFL for tasks assessed/performed Overall Cognitive Status: Within  Functional Limits for tasks assessed    Extremity/Trunk Assessment Upper Extremity Assessment Upper  Extremity Assessment: Defer to OT evaluation Lower Extremity Assessment Lower Extremity Assessment: LLE deficits/detail LLE Deficits / Details: Generally a little weak, 4/5.  Diminished soft touch sensation.   LLE Sensation: decreased light touch LLE Coordination: decreased fine motor Cervical / Trunk Assessment Cervical / Trunk Assessment: Normal   Balance Balance Balance Assessed: Yes Static Standing Balance Static Standing - Balance Support: No upper extremity supported;During functional activity Static Standing - Level of Assistance: 5: Stand by assistance  End of Session PT - End of Session Equipment Utilized During Treatment: Gait belt Activity Tolerance: Patient tolerated treatment well Patient left: in bed;with call bell/phone within reach (Sitting EOB with OT) Nurse Communication: Mobility status  GP     RitenourAlison Murray, PT [phone] 05/12/2013, 8:55 AM

## 2013-05-12 NOTE — Progress Notes (Signed)
Occupational Therapy Treatment Patient Details Name: Sara Banks MRN: [id number] DOB: March 23, 1961 Today's Date: 05/12/2013 Time: 7829-5621 OT Time Calculation (min): 25 min  OT Assessment / Plan / Recommendation  History of present illness pt presents with 2 days of L ear ringing, fullness, dizziness, and L sided numbness and weakness.     OT comments  Returned this afternoon due to limited evaluation (pt transported to MRI).  Pt c/o increased dizziness this afternoon and limited to stand pivot to bedside commode due to dizziness. RN made aware. Will continue to follow acutely.  Follow Up Recommendations  Outpatient OT;Supervision - Intermittent    Barriers to Discharge       Equipment Recommendations   (TBD)    Recommendations for Other Services    Frequency Min 2X/week   Progress towards OT Goals Progress towards OT goals: Progressing toward goals  Plan Discharge plan remains appropriate    Precautions / Restrictions Precautions Precautions: Fall   Pertinent Vitals/Pain See vitals    ADL  Grooming: Performed;Wash/dry hands;Set up Where Assessed - Grooming: Unsupported sitting Toilet Transfer: Performed;Min guard Statistician Method: Surveyor, minerals: Materials engineer and Hygiene: Performed;Min guard Where Assessed - Engineer, mining and Hygiene: Sit to stand from 3-in-1 or toilet Transfers/Ambulation Related to ADLs: Min guard for safety due to dizziness ADL Comments: Upon OT arrival, pt c/o significant dizziness comparible to when she was admitted to hospital.  Pt adamant about ambulating to bathroom despite dizziness.  OT and nursing staff discussed concern for pt safety ambulating when feeling so dizzy. Pt finally agreeable to using bedside commode.  Able to use LUE when pulling undergarment up/down over hips.    OT Diagnosis:    OT Problem List:   OT Treatment Interventions:     OT  Goals(current goals can now be found in the care plan section) Acute Rehab OT Goals Patient Stated Goal: Back to normal OT Goal Formulation: With patient Time For Goal Achievement: 05/19/13 Potential to Achieve Goals: Good ADL Goals Pt Will Transfer to Toilet: with modified independence;ambulating;regular height toilet Additional ADL Goal #1: Pt will independently perform vision HEP 2x daily. Additional ADL Goal #2: Pt will retrieve ADL items at mod I level.  Visit Information  Last OT Received On: 05/12/13 Assistance Needed: +1 History of Present Illness: pt presents with 2 days of L ear ringing, fullness, dizziness, and L sided numbness and weakness.      Subjective Data      Prior Functioning       Cognition  Cognition Arousal/Alertness: Awake/alert Behavior During Therapy: WFL for tasks assessed/performed Overall Cognitive Status: Within Functional Limits for tasks assessed    Mobility  Bed Mobility Bed Mobility: Supine to Sit;Sitting - Scoot to Edge of Bed;Sit to Supine Supine to Sit: 5: Supervision Sitting - Scoot to Edge of Bed: 5: Supervision Sit to Supine: 5: Supervision Transfers Transfers: Sit to Stand;Stand to Sit Sit to Stand: 4: Min guard;From bed;From chair/3-in-1 Stand to Sit: 4: Min guard;To bed;To chair/3-in-1    Exercises      Balance     End of Session OT - End of Session Activity Tolerance:  (limited by dizziness) Patient left: in bed;with nursing/sitter in room;with call bell/phone within reach Nurse Communication: Mobility status (c/o dizziness)  GO   05/12/2013 Cipriano Mile OTR/L Pager [phone] Office [phone]   Cipriano Mile 05/12/2013, 2:31 PM

## 2013-05-12 NOTE — Progress Notes (Signed)
Family Medicine Teaching Service Daily Progress Note Intern Pager: [phone]  Patient name: Sara Banks Medical record number: [id number] Date of birth: 11-14-1960 Age: 52 y.o. Gender: female  Primary Care Provider: Jackie Plum, MD Consultants: Neurology Code Status: Full  Pt Overview and Major Events to Date:  11/28 - Admitted, symptoms resolved except left-sided numbness 11/29 - Rpt MRI with DWI of brainstem shows concern for stroke  Assessment and Plan: Sara Banks is a 52 y.o. female presenting with left sided weakness and dizziness . PMH is significant for hepatitis C, tobacco use > 20 years, and strong family history of CVA. She is currently not having any symptoms of CVA aside from numbness.   # Left sided weakness/numbness: Patient's history suspicious for TIA vs CVA. Risk factors include strong family history of stroke (unsure if early age), obesity and smoking. Symptoms, for the most part, have resolved. Physical exam is not remarkable for stroke symptoms at this tm.  Original MRI normal, repeat MRI with DWI show restricted diffusion of right lower pons concerning for stroke Treat as if stroke with ASA 325 mg for antiplatelet and lipitor 80 mg for lipid F/u carotid doppler and echo Encourage smoking cessation  We appreciate the recommendations of the stroke team   # Chest heaviness: resolved, prior to admission; complaint of chest heaviness with associated diaphoresis from conversation with neurology. Patient not currently complaining of chest pain. Could be anxiety vs ACS vs musculoskeletal. Has risk factors for CAD including obesity and smoking.  Trop neg and EKG normal No further work up Consider outpt stress test given risk factors   # Hepatitis C  Continue home ribavirin and sofosbuvir  #Tobacco abuse: has a 20 pack-year history. Currently smoking 1/2 PPD.  Nicotine patch if patient requests  Encourage smoking cessation, will order counseling   #  Chronic back pain  Will not resume home etodalac or hyrocodone-ibuprofen given increased risk of CV event  Continue gabapentin  Start oxycodone 5mg  q6hours PRN for pain  FEN/GI: Heart healthy (passed bedside swallow); saline lock IV  Prophylaxis: heparin subq   Disposition: Possible d/c 05/12/13 if studies back and normal  Subjective:  Pt notes intermittent "tingling" of extremities, denies weakness or dizziness; no nausea, vomiting or chest pain   Objective: Temp:  [97.3 F (36.3 C)-98.5 F (36.9 C)] 97.9 F (36.6 C) (11/29 0826) Pulse Rate:  [56-106] 94 (11/29 0826) Resp:  [14-23] 18 (11/29 0826) BP: (96-146)/(59-116) 112/67 mmHg (11/29 0826) SpO2:  [95 %-100 %] 100 % (11/29 0826) Weight:  [222 lb (100.699 kg)] 222 lb (100.699 kg) (11/28 2228) Physical Exam: General: well appearing, non distressed pleasant Cardiovascular: CV, RRR, no murmurs Respiratory: CTA-B Neuro: CN II-XII grossly intact, 5/5 strength of extremities, negative Babinski bilaterally  Laboratory:  Recent Labs Lab 05/11/13 1232 05/11/13 2025  WBC 6.2 8.8  HGB 14.6 15.4*  HCT 43.2 45.3  PLT 297 363    Recent Labs Lab 05/11/13 1232 05/11/13 2025  NA 135  --   K 4.0  --   CL 103  --   CO2 22  --   BUN 9  --   CREATININE 0.66 0.90  CALCIUM 9.0  --   PROT 7.2  --   BILITOT 0.5  --   ALKPHOS 75  --   ALT 15  --   AST 19  --   GLUCOSE 98  --     a1c 5.4 Lipid Panel     Component Value Date/Time  CHOL 182 05/12/2013 0550   TRIG 210* 05/12/2013 0550   HDL 39* 05/12/2013 0550   CHOLHDL 4.7 05/12/2013 0550   VLDL 42* 05/12/2013 0550   LDLCALC 101* 05/12/2013 0550      Imaging/Diagnostic Tests: Dg Chest 2 View  05/11/2013   CLINICAL DATA:  Hypertension, dizziness, and nausea  EXAM: CHEST  2 VIEW  COMPARISON:  None.  FINDINGS: The lungs are adequately inflated. There are mildly increased linear interstitial markings in the mid lungs suggesting subsegmental atelectasis. There is no  pleural effusion or pneumothorax. The cardiac silhouette is normal in size. The mediastinum is normal in width. There is no pleural effusion. The bony thorax exhibits no acute abnormality.  IMPRESSION: There is bilateral perihilar subsegmental atelectasis. There is no focal pneumonia nor evidence of CHF. If there are clinical findings that might suggest pulmonary embolism, CT angiography of the chest would be of value.   Electronically Signed   By: David  [place]   On: 05/11/2013 13:07   Ct Head Wo Contrast  05/11/2013   CLINICAL DATA:  Dizziness.  Nausea.  EXAM: CT HEAD WITHOUT CONTRAST  TECHNIQUE: Contiguous axial images were obtained from the base of the skull through the vertex without intravenous contrast.  COMPARISON:  CT 08/24/2009.  FINDINGS: No mass. No hydrocephalus. No hemorrhage. No acute bony abnormality identified. Visualized paranasal sinuses and mastoids are clear. Posterior fossa is unremarkable.  IMPRESSION: Negative exam.   Electronically Signed   By: Maisie Fus  Register   On: 05/11/2013 13:09   Mr Laqueta Jean Wo Contrast  05/11/2013   CLINICAL DATA:  52 year old female acute onset dizziness nausea and vomiting. Diaphoresis. Off balance. Initial encounter.  EXAM: MRI HEAD WITHOUT AND WITH CONTRAST  TECHNIQUE: Multiplanar, multiecho pulse sequences of the brain and surrounding structures were obtained without and with intravenous contrast.  CONTRAST:  19mL MULTIHANCE GADOBENATE DIMEGLUMINE 529 MG/ML IV SOLN  COMPARISON:  Head CTs 05/11/2013 and earlier.  FINDINGS: Normal cerebral volume. No restricted diffusion to suggest acute infarction. No midline shift, mass effect, evidence of mass lesion, ventriculomegaly, extra-axial collection or acute intracranial hemorrhage. Cervicomedullary junction and pituitary are within normal limits. Negative visualized cervical spine. Major intracranial vascular flow voids are preserved.  Wallace Cullens and white matter signal is within normal limits for age throughout the  brain. No abnormal enhancement identified. Visible internal auditory structures appear normal. Mastoids are clear.  Visualized orbit soft tissues are within normal limits. Normal paranasal sinuses. Visualized scalp soft tissues are within normal limits. Visualized bone marrow signal is within normal limits.  IMPRESSION: Normal for age MRI appearance of the brain.   Electronically Signed   By: Augusto Gamble M.D.   On: 05/11/2013 16:31   Mr Brain Ltd W/o Cm  05/12/2013   CLINICAL DATA:  52 year old female with acute onset dizziness, nausea, vomiting. Diaphoresis. Initial encounter.  EXAM: LIMITED MRI HEAD WITHOUT CONTRAST  TECHNIQUE: Multiplanar, multiecho pulse sequences of the brain and surrounding structures were obtained without intravenous contrast.  COMPARISON:  Brain MRI 05/11/2013.  FINDINGS: Limited follow-up exam requested by neurology. Today's exam consists of axial and coronal diffusion weighted imaging only.  Center diffusion-weighted imaging performed, with subsequent mild increase and image noise.  On axial images, the only areas suspicious for restricted diffusion is a punctate focus in the dorsal lower pons on the right of midline (series 3 and series 300, image 11). However, this focus is not correlated on today's coronal diffusion, and not identified on yesterday's comparison.  No other  suspicious diffusion signal on axial or coronal acquired data sets.  No intracranial mass effect or ventriculomegaly. Overall cerebral volume and morphology appear stable.  IMPRESSION: Questionable solitary, punctate focus of restricted diffusion in the dorsal lower pons on the right, seen only on the axial series of today's diffusion-weighted imaging, and not present on the study from yesterday.   Electronically Signed   By: Augusto Gamble M.D.   On: 05/12/2013 09:46     Garnetta Buddy, MD 05/12/2013, 10:29 AM PGY-3, [facility] FPTS Intern pager: [phone], text pages welcome

## 2013-05-13 ENCOUNTER — Encounter: Payer: Self-pay | Admitting: Family Medicine

## 2013-05-13 NOTE — Discharge Summary (Signed)
I agree with the discharge summary as documented. Patient will required outpatient workup for tinnitus/dizziness if symptoms persist. Although she had evidence of CVA on MRI there may be underlying Meniere's disease.   Donnella Sham MD

## 2013-05-15 ENCOUNTER — Encounter: Payer: Self-pay | Admitting: Family Medicine

## 2013-08-16 ENCOUNTER — Other Ambulatory Visit: Payer: Self-pay | Admitting: Internal Medicine

## 2013-08-16 DIAGNOSIS — C220 Liver cell carcinoma: Secondary | ICD-10-CM

## 2013-08-23 ENCOUNTER — Other Ambulatory Visit: Payer: Self-pay | Admitting: Internal Medicine

## 2013-08-23 ENCOUNTER — Ambulatory Visit
Admission: RE | Admit: 2013-08-23 | Discharge: 2013-08-23 | Disposition: A | Discharge status: Home / Self Care | Payer: BC Managed Care – PPO | Source: Ambulatory Visit | Attending: Internal Medicine | Admitting: Internal Medicine

## 2013-08-23 DIAGNOSIS — C220 Liver cell carcinoma: Secondary | ICD-10-CM

## 2014-02-06 ENCOUNTER — Other Ambulatory Visit (HOSPITAL_COMMUNITY): Payer: Self-pay | Admitting: Family Medicine

## 2014-05-23 ENCOUNTER — Other Ambulatory Visit (HOSPITAL_COMMUNITY): Payer: Self-pay | Admitting: Nurse Practitioner

## 2014-05-23 DIAGNOSIS — B1920 Unspecified viral hepatitis C without hepatic coma: Secondary | ICD-10-CM

## 2014-07-09 ENCOUNTER — Ambulatory Visit (HOSPITAL_COMMUNITY)
Admission: RE | Admit: 2014-07-09 | Discharge: 2014-07-09 | Disposition: A | Discharge status: Home / Self Care | Payer: BLUE CROSS/BLUE SHIELD | Source: Ambulatory Visit | Attending: Nurse Practitioner | Admitting: Nurse Practitioner

## 2014-07-09 DIAGNOSIS — B1920 Unspecified viral hepatitis C without hepatic coma: Secondary | ICD-10-CM | POA: Insufficient documentation

## 2014-12-10 ENCOUNTER — Ambulatory Visit (INDEPENDENT_AMBULATORY_CARE_PROVIDER_SITE_OTHER): Payer: BLUE CROSS/BLUE SHIELD | Admitting: Emergency Medicine

## 2014-12-10 ENCOUNTER — Ambulatory Visit (INDEPENDENT_AMBULATORY_CARE_PROVIDER_SITE_OTHER): Payer: BLUE CROSS/BLUE SHIELD

## 2014-12-10 VITALS — BP 124/86 | HR 72 | Temp 98.5°F | Resp 16 | Ht 66.0 in | Wt 213.0 lb

## 2014-12-10 DIAGNOSIS — M2392 Unspecified internal derangement of left knee: Secondary | ICD-10-CM | POA: Diagnosis not present

## 2014-12-10 MED ORDER — NAPROXEN SODIUM 550 MG PO TABS: 550.0000 mg | ORAL_TABLET | Freq: Two times a day (BID) | ORAL | Status: DC

## 2014-12-10 MED ORDER — HYDROCODONE-ACETAMINOPHEN 5-325 MG PO TABS: 1.0000 | ORAL_TABLET | ORAL | Status: DC | PRN

## 2014-12-10 NOTE — Addendum Note (Signed)
Addended by: Roselee Culver on: 12/10/2014 03:35 PM   Modules accepted: Miquel Dunn

## 2014-12-10 NOTE — Progress Notes (Addendum)
Subjective:  Patient ID: Sara Banks, female    DOB: 07-26-1960  Age: 54 y.o. MRN: [id number]  CC: Knee Pain   HPI Sara Banks presents  with an injury to her knee. Multiple knee surgeries. She has swelling and limited motion in her knee. She has pain with ambulation and flexion. She has no improvement with over-the-counter medication.  History Sara Banks has a past medical history of Arthritis; Anxiety; Depression; Hepatitis C (dx 2014); and Chronic pain.   She has past surgical history that includes Knee arthroscopy w/ ACL reconstruction and transthoracic echocardiogram (2014).   Her  family history includes Colon polyps in her father.  She   reports that she has been smoking Cigarettes.  She has been smoking about 0.50 packs per day. She has never used smokeless tobacco. She reports that she does not drink alcohol or use illicit drugs.  Outpatient Prescriptions Prior to Visit  Medication Sig Dispense Refill  . gabapentin (NEURONTIN) 100 MG capsule Take 100-300 mg by mouth 2 (two) times daily as needed (for pain/sleep).     Marland Kitchen aspirin EC 81 MG tablet Take 1 tablet (81 mg total) by mouth daily. (Patient not taking: Reported on 12/10/2014) 90 tablet 1  . atorvastatin (LIPITOR) 80 MG tablet Take 1 tablet (80 mg total) by mouth daily at 6 PM. (Patient not taking: Reported on 12/10/2014) 90 tablet 0  . cyclobenzaprine (FLEXERIL) 10 MG tablet Take 10 mg by mouth 3 (three) times daily as needed for muscle spasms.    . meclizine (ANTIVERT) 25 MG tablet Take 1 tablet (25 mg total) by mouth 3 (three) times daily as needed for dizziness. (Patient not taking: Reported on 12/10/2014) 90 tablet 0  . ondansetron (ZOFRAN) 4 MG tablet Take 1 tablet (4 mg total) by mouth every 8 (eight) hours as needed for nausea or vomiting. (Patient not taking: Reported on 12/10/2014) 20 tablet 0  . ribavirin (REBETOL) 200 MG capsule Take 600 mg by mouth 2 (two) times daily.    . Sofosbuvir (SOVALDI) 400 MG TABS  Take 400 mg by mouth daily.     No facility-administered medications prior to visit.    History   Social History  . Marital Status: Married    Spouse Name: N/A  . Number of Children: N/A  . Years of Education: N/A   Social History Main Topics  . Smoking status: Current Every Day Smoker -- 0.50 packs/day    Types: Cigarettes  . Smokeless tobacco: Never Used  . Alcohol Use: No     Comment: rarely once a year wine  . Drug Use: No  . Sexual Activity: Yes   Other Topics Concern  . None   Social History Narrative     Review of Systems  Constitutional: Negative for fever, chills and appetite change.  HENT: Negative for congestion, ear pain, postnasal drip, sinus pressure and sore throat.   Eyes: Negative for pain and redness.  Respiratory: Negative for cough, shortness of breath and wheezing.   Cardiovascular: Negative for leg swelling.  Gastrointestinal: Negative for nausea, vomiting, abdominal pain, diarrhea, constipation and blood in stool.  Endocrine: Negative for polyuria.  Genitourinary: Negative for dysuria, urgency, frequency and flank pain.  Musculoskeletal: Negative for gait problem.  Skin: Negative for rash.  Neurological: Negative for weakness and headaches.  Psychiatric/Behavioral: Negative for confusion and decreased concentration. The patient is not nervous/anxious.     Objective:  BP 124/86 mmHg  Pulse 72  Temp(Src) 98.5 F (36.9 C) (  Oral)  Resp 16  Ht 5\' 6"  (1.676 m)  Wt 213 lb (96.616 kg)  BMI 34.40 kg/m2  SpO2 96%  Physical Exam  Constitutional: She is oriented to person, place, and time. She appears well-developed and well-nourished.  HENT:  Head: Normocephalic and atraumatic.  Eyes: Conjunctivae are normal. Pupils are equal, round, and reactive to light.  Pulmonary/Chest: Effort normal.  Musculoskeletal: She exhibits no edema.  Neurological: She is alert and oriented to person, place, and time.  Skin: Skin is dry.  Psychiatric: She has a  normal mood and affect. Her behavior is normal. Thought content normal.      Assessment & Plan:   Sara Banks was seen today for knee pain.  Diagnoses and all orders for this visit:  Internal derangement of knee, left Orders: -     DG Knee Complete 4 Views Left; Future  Other orders -     naproxen sodium (ANAPROX DS) 550 MG tablet; Take 1 tablet (550 mg total) by mouth 2 (two) times daily with a meal. -     HYDROcodone-acetaminophen (NORCO) 5-325 MG per tablet; Take 1-2 tablets by mouth every 4 (four) hours as needed.   I am having Sara Banks start on naproxen sodium and HYDROcodone-acetaminophen. I am also having her maintain her cyclobenzaprine, gabapentin, ribavirin, Sofosbuvir, atorvastatin, meclizine, ondansetron, and aspirin EC.  Meds ordered this encounter  Medications  . naproxen sodium (ANAPROX DS) 550 MG tablet    Sig: Take 1 tablet (550 mg total) by mouth 2 (two) times daily with a meal.    Dispense:  40 tablet    Refill:  0  . HYDROcodone-acetaminophen (NORCO) 5-325 MG per tablet    Sig: Take 1-2 tablets by mouth every 4 (four) hours as needed.    Dispense:  30 tablet    Refill:  0   She was encouraged to use a crutches and a knee immobilizer for a week. After that time she still having trouble with elevation and rest and ice then we will get her in to see an orthopedic surgeon regarding an MRI.  Appropriate red flag conditions were discussed with the patient as well as actions that should be taken.  Patient expressed his understanding.  Follow-up: Return in about 1 week (around 12/17/2014).  Roselee Culver, MD    [facility] reading (PRIMARY) by  Dr. Ouida Sills negative.

## 2014-12-10 NOTE — Patient Instructions (Signed)

## 2015-01-30 ENCOUNTER — Other Ambulatory Visit: Payer: Self-pay | Admitting: Emergency Medicine

## 2015-10-04 ENCOUNTER — Emergency Department (HOSPITAL_COMMUNITY): Payer: BLUE CROSS/BLUE SHIELD

## 2015-10-04 ENCOUNTER — Emergency Department (HOSPITAL_COMMUNITY)
Admission: EM | Admit: 2015-10-04 | Discharge: 2015-10-04 | Disposition: A | Discharge status: Home / Self Care | Payer: BLUE CROSS/BLUE SHIELD | Attending: Emergency Medicine | Admitting: Emergency Medicine

## 2015-10-04 ENCOUNTER — Encounter (HOSPITAL_COMMUNITY): Payer: Self-pay | Admitting: Oncology

## 2015-10-04 DIAGNOSIS — N132 Hydronephrosis with renal and ureteral calculous obstruction: Secondary | ICD-10-CM | POA: Insufficient documentation

## 2015-10-04 DIAGNOSIS — Z7982 Long term (current) use of aspirin: Secondary | ICD-10-CM | POA: Insufficient documentation

## 2015-10-04 DIAGNOSIS — R109 Unspecified abdominal pain: Secondary | ICD-10-CM

## 2015-10-04 DIAGNOSIS — Z794 Long term (current) use of insulin: Secondary | ICD-10-CM | POA: Diagnosis not present

## 2015-10-04 DIAGNOSIS — F17210 Nicotine dependence, cigarettes, uncomplicated: Secondary | ICD-10-CM | POA: Insufficient documentation

## 2015-10-04 DIAGNOSIS — F329 Major depressive disorder, single episode, unspecified: Secondary | ICD-10-CM | POA: Diagnosis not present

## 2015-10-04 DIAGNOSIS — E119 Type 2 diabetes mellitus without complications: Secondary | ICD-10-CM | POA: Insufficient documentation

## 2015-10-04 DIAGNOSIS — Z79891 Long term (current) use of opiate analgesic: Secondary | ICD-10-CM | POA: Insufficient documentation

## 2015-10-04 DIAGNOSIS — Z791 Long term (current) use of non-steroidal anti-inflammatories (NSAID): Secondary | ICD-10-CM | POA: Diagnosis not present

## 2015-10-04 DIAGNOSIS — N201 Calculus of ureter: Secondary | ICD-10-CM

## 2015-10-04 DIAGNOSIS — M1990 Unspecified osteoarthritis, unspecified site: Secondary | ICD-10-CM | POA: Insufficient documentation

## 2015-10-04 DIAGNOSIS — D751 Secondary polycythemia: Secondary | ICD-10-CM

## 2015-10-04 LAB — URINALYSIS, ROUTINE W REFLEX MICROSCOPIC
Bilirubin Urine: NEGATIVE
KETONES UR: NEGATIVE mg/dL
LEUKOCYTES UA: NEGATIVE
Nitrite: NEGATIVE
Protein, ur: NEGATIVE mg/dL
Specific Gravity, Urine: 1.013 (ref 1.005–1.030)
pH: 6.5 (ref 5.0–8.0)

## 2015-10-04 LAB — CBC WITH DIFFERENTIAL/PLATELET
Basophils Absolute: 0 10*3/uL (ref 0.0–0.1)
Basophils Relative: 0 %
EOS ABS: 0.4 10*3/uL (ref 0.0–0.7)
Eosinophils Relative: 5 %
HEMATOCRIT: 47.5 % — AB (ref 36.0–46.0)
HEMOGLOBIN: 16.8 g/dL — AB (ref 12.0–15.0)
LYMPHS ABS: 2 10*3/uL (ref 0.7–4.0)
LYMPHS PCT: 25 %
MCH: 30.7 pg (ref 26.0–34.0)
MCHC: 35.4 g/dL (ref 30.0–36.0)
MCV: 86.8 fL (ref 78.0–100.0)
MONOS PCT: 7 %
Monocytes Absolute: 0.6 10*3/uL (ref 0.1–1.0)
NEUTROS ABS: 5 10*3/uL (ref 1.7–7.7)
NEUTROS PCT: 63 %
Platelets: 291 10*3/uL (ref 150–400)
RBC: 5.47 MIL/uL — AB (ref 3.87–5.11)
RDW: 13.1 % (ref 11.5–15.5)
WBC: 8 10*3/uL (ref 4.0–10.5)

## 2015-10-04 LAB — COMPREHENSIVE METABOLIC PANEL
ALT: 28 U/L (ref 14–54)
AST: 27 U/L (ref 15–41)
Albumin: 4.3 g/dL (ref 3.5–5.0)
Alkaline Phosphatase: 115 U/L (ref 38–126)
Anion gap: 13 (ref 5–15)
BILIRUBIN TOTAL: 0.8 mg/dL (ref 0.3–1.2)
BUN: 21 mg/dL — AB (ref 6–20)
CHLORIDE: 103 mmol/L (ref 101–111)
CO2: 20 mmol/L — ABNORMAL LOW (ref 22–32)
Calcium: 9 mg/dL (ref 8.9–10.3)
Creatinine, Ser: 0.91 mg/dL (ref 0.44–1.00)
Glucose, Bld: 291 mg/dL — ABNORMAL HIGH (ref 65–99)
POTASSIUM: 4.7 mmol/L (ref 3.5–5.1)
Sodium: 136 mmol/L (ref 135–145)
TOTAL PROTEIN: 7.5 g/dL (ref 6.5–8.1)

## 2015-10-04 LAB — LIPASE, BLOOD: LIPASE: 25 U/L (ref 11–51)

## 2015-10-04 LAB — URINE MICROSCOPIC-ADD ON: WBC, UA: NONE SEEN WBC/hpf (ref 0–5)

## 2015-10-04 MED ORDER — ONDANSETRON HCL 4 MG/2ML IJ SOLN
4.0000 mg | Freq: Once | INTRAMUSCULAR | Status: AC
  Administered 2015-10-04: 4 mg via INTRAVENOUS

## 2015-10-04 MED ORDER — SODIUM CHLORIDE 0.9 % IV SOLN
1000.0000 mL | INTRAVENOUS | Status: DC
  Administered 2015-10-04: 1000 mL via INTRAVENOUS

## 2015-10-04 MED ORDER — TAMSULOSIN HCL 0.4 MG PO CAPS: 0.4000 mg | ORAL_CAPSULE | Freq: Every day | ORAL | Status: DC

## 2015-10-04 MED ORDER — MORPHINE SULFATE (PF) 4 MG/ML IV SOLN
INTRAVENOUS | Status: AC
  Filled 2015-10-04: qty 1

## 2015-10-04 MED ORDER — METOCLOPRAMIDE HCL 5 MG/ML IJ SOLN
10.0000 mg | Freq: Once | INTRAMUSCULAR | Status: AC
  Administered 2015-10-04: 10 mg via INTRAVENOUS
  Filled 2015-10-04: qty 2

## 2015-10-04 MED ORDER — MORPHINE SULFATE (PF) 4 MG/ML IV SOLN
4.0000 mg | Freq: Once | INTRAVENOUS | Status: AC
  Administered 2015-10-04: 4 mg via INTRAVENOUS

## 2015-10-04 MED ORDER — OXYCODONE-ACETAMINOPHEN 5-325 MG PO TABS: 1.0000 | ORAL_TABLET | ORAL | Status: DC | PRN

## 2015-10-04 MED ORDER — METOCLOPRAMIDE HCL 10 MG PO TABS: 10.0000 mg | ORAL_TABLET | Freq: Four times a day (QID) | ORAL | Status: DC

## 2015-10-04 MED ORDER — DIPHENHYDRAMINE HCL 50 MG/ML IJ SOLN
25.0000 mg | Freq: Once | INTRAMUSCULAR | Status: AC
  Administered 2015-10-04: 25 mg via INTRAVENOUS
  Filled 2015-10-04: qty 1

## 2015-10-04 MED ORDER — SODIUM CHLORIDE 0.9 % IV SOLN
1000.0000 mL | Freq: Once | INTRAVENOUS | Status: AC
  Administered 2015-10-04: 1000 mL via INTRAVENOUS

## 2015-10-04 MED ORDER — ONDANSETRON HCL 4 MG/2ML IJ SOLN
INTRAMUSCULAR | Status: AC
  Filled 2015-10-04: qty 2

## 2015-10-04 MED ORDER — KETOROLAC TROMETHAMINE 30 MG/ML IJ SOLN
30.0000 mg | Freq: Once | INTRAMUSCULAR | Status: AC
  Administered 2015-10-04: 30 mg via INTRAVENOUS
  Filled 2015-10-04: qty 1

## 2015-10-04 NOTE — ED Notes (Signed)
Pt c/o left sided flank pain x 3 hours that has not subsided.  Denies OTC pain relievers PTA.  Pt rates pain 10/10, sharp and stabbing in nature.  Pt is tachycardic and hypertensive in triage.

## 2015-10-04 NOTE — ED Notes (Signed)
MD at bedside. 

## 2015-10-04 NOTE — ED Provider Notes (Signed)
CSN: [id number]     Arrival date & time 10/04/15  0040 History  By signing my name below, I, Emmanuella Mensah, attest that this documentation has been prepared under the direction and in the presence of Delora Fuel, MD. Electronically Signed: Judithann Sauger, ED Scribe. 10/04/2015. 1:01 AM.    Chief Complaint  Patient presents with  . Flank Pain   The history is provided by the patient. No language interpreter was used.   HPI Comments: Sara Banks is a 55 y.o. female with a hx of Hepatitis C who presents to the Emergency Department complaining of gradually worsening 10/10 constant sharp left sided flank pain onset 3 hours ago. She reports associated subjective fever, chills, diaphoresis, nausea, and dysuria. She states that the pain is worse when she sits still. No alleviating factors noted. Pt has not tried any medications PTA. She denies previous similar symptoms. She denies any vomiting.   PCP: Dr. Vista Lawman   Past Medical History  Diagnosis Date  . Arthritis   . Anxiety   . Depression   . Hepatitis C dx 2014    [facility]  . Chronic pain    Past Surgical History  Procedure Laterality Date  . Knee arthroscopy w/ acl reconstruction      right knee x 3  . Transthoracic echocardiogram  2014    normal   Family History  Problem Relation Age of Onset  . Colon polyps Father    Social History  Substance Use Topics  . Smoking status: Current Every Day Smoker -- 0.50 packs/day    Types: Cigarettes  . Smokeless tobacco: Never Used  . Alcohol Use: No     Comment: rarely once a year wine   OB History    No data available     Review of Systems  Constitutional: Positive for fever, chills and diaphoresis.  Gastrointestinal: Positive for nausea. Negative for vomiting.  Genitourinary: Positive for dysuria and flank pain.  All other systems reviewed and are negative.     Allergies  Review of patient's allergies indicates no known allergies.  Home  Medications   Prior to Admission medications   Medication Sig Start Date End Date Taking? Authorizing Provider  aspirin EC 81 MG tablet Take 1 tablet (81 mg total) by mouth daily. Patient not taking: Reported on 12/10/2014 05/12/13   Coral Spikes, DO  atorvastatin (LIPITOR) 80 MG tablet Take 1 tablet (80 mg total) by mouth daily at 6 PM. Patient not taking: Reported on 12/10/2014 05/12/13   Coral Spikes, DO  cyclobenzaprine (FLEXERIL) 10 MG tablet Take 10 mg by mouth 3 (three) times daily as needed for muscle spasms.    Historical Provider, MD  gabapentin (NEURONTIN) 100 MG capsule Take 100-300 mg by mouth 2 (two) times daily as needed (for pain/sleep).     Historical Provider, MD  HYDROcodone-acetaminophen (NORCO) 5-325 MG per tablet Take 1-2 tablets by mouth every 4 (four) hours as needed. 12/10/14   Roselee Culver, MD  meclizine (ANTIVERT) 25 MG tablet Take 1 tablet (25 mg total) by mouth 3 (three) times daily as needed for dizziness. Patient not taking: Reported on 12/10/2014 05/12/13   Coral Spikes, DO  naproxen sodium (ANAPROX) 550 MG tablet TAKE 1 TABLET(550 MG) BY MOUTH TWICE DAILY WITH A MEAL 01/30/15   Roselee Culver, MD  ondansetron (ZOFRAN) 4 MG tablet Take 1 tablet (4 mg total) by mouth every 8 (eight) hours as needed for nausea or vomiting.  Patient not taking: Reported on 12/10/2014 05/12/13   Coral Spikes, DO  ribavirin (REBETOL) 200 MG capsule Take 600 mg by mouth 2 (two) times daily.    Historical Provider, MD  Sofosbuvir (SOVALDI) 400 MG TABS Take 400 mg by mouth daily.    Historical Provider, MD   BP 176/99 mmHg  Pulse 114  Temp(Src) 97.7 F (36.5 C) (Oral)  Resp 22  SpO2 99% Physical Exam  Constitutional: She is oriented to person, place, and time. She appears well-developed and well-nourished. No distress.  In pain, appears restless   HENT:  Head: Normocephalic and atraumatic.  Eyes: Conjunctivae and EOM are normal. Pupils are equal, round, and reactive to light.   Neck: Normal range of motion. Neck supple. No JVD present.  Cardiovascular: Normal rate, regular rhythm and normal heart sounds.   No murmur heard. Pulmonary/Chest: Effort normal and breath sounds normal. She has no wheezes. She has no rales. She exhibits no tenderness.  Abdominal: Soft. Bowel sounds are normal. She exhibits no distension and no mass.  Moderate left CVA tenderness  Musculoskeletal: Normal range of motion. She exhibits no edema.  Lymphadenopathy:    She has no cervical adenopathy.  Neurological: She is alert and oriented to person, place, and time. No cranial nerve deficit. She exhibits normal muscle tone. Coordination normal.  Skin: Skin is warm and dry. No rash noted. No pallor.  Psychiatric: She has a normal mood and affect. Her behavior is normal. Judgment and thought content normal.  Nursing note and vitals reviewed.   ED Course  Procedures (including critical care time) DIAGNOSTIC STUDIES: Oxygen Saturation is 99% on RA, normal by my interpretation.    COORDINATION OF CARE: 1:01 AM- Pt advised of plan for treatment and pt agrees. Pt will receive UA and CT scan for further evaluation. Pt will receive Iv fluids   Labs Review Results for orders placed or performed during the hospital encounter of 10/04/15  Urinalysis, Routine w reflex microscopic-may I&O cath if menses (not at [facility])  Result Value Ref Range   Color, Urine YELLOW YELLOW   APPearance CLEAR CLEAR   Specific Gravity, Urine 1.013 1.005 - 1.030   pH 6.5 5.0 - 8.0   Glucose, UA >1000 (A) NEGATIVE mg/dL   Hgb urine dipstick LARGE (A) NEGATIVE   Bilirubin Urine NEGATIVE NEGATIVE   Ketones, ur NEGATIVE NEGATIVE mg/dL   Protein, ur NEGATIVE NEGATIVE mg/dL   Nitrite NEGATIVE NEGATIVE   Leukocytes, UA NEGATIVE NEGATIVE  Comprehensive metabolic panel  Result Value Ref Range   Sodium 136 135 - 145 mmol/L   Potassium 4.7 3.5 - 5.1 mmol/L   Chloride 103 101 - 111 mmol/L   CO2 20 (L) 22 - 32 mmol/L    Glucose, Bld 291 (H) 65 - 99 mg/dL   BUN 21 (H) 6 - 20 mg/dL   Creatinine, Ser 0.91 0.44 - 1.00 mg/dL   Calcium 9.0 8.9 - 10.3 mg/dL   Total Protein 7.5 6.5 - 8.1 g/dL   Albumin 4.3 3.5 - 5.0 g/dL   AST 27 15 - 41 U/L   ALT 28 14 - 54 U/L   Alkaline Phosphatase 115 38 - 126 U/L   Total Bilirubin 0.8 0.3 - 1.2 mg/dL   GFR calc non Af Amer >60 >60 mL/min   GFR calc Af Amer >60 >60 mL/min   Anion gap 13 5 - 15  Lipase, blood  Result Value Ref Range   Lipase 25 11 - 51 U/L  CBC with Differential  Result Value Ref Range   WBC 8.0 4.0 - 10.5 K/uL   RBC 5.47 (H) 3.87 - 5.11 MIL/uL   Hemoglobin 16.8 (H) 12.0 - 15.0 g/dL   HCT 47.5 (H) 36.0 - 46.0 %   MCV 86.8 78.0 - 100.0 fL   MCH 30.7 26.0 - 34.0 pg   MCHC 35.4 30.0 - 36.0 g/dL   RDW 13.1 11.5 - 15.5 %   Platelets 291 150 - 400 K/uL   Neutrophils Relative % 63 %   Neutro Abs 5.0 1.7 - 7.7 K/uL   Lymphocytes Relative 25 %   Lymphs Abs 2.0 0.7 - 4.0 K/uL   Monocytes Relative 7 %   Monocytes Absolute 0.6 0.1 - 1.0 K/uL   Eosinophils Relative 5 %   Eosinophils Absolute 0.4 0.0 - 0.7 K/uL   Basophils Relative 0 %   Basophils Absolute 0.0 0.0 - 0.1 K/uL  Urine microscopic-add on  Result Value Ref Range   Squamous Epithelial / LPF 0-5 (A) NONE SEEN   WBC, UA NONE SEEN 0 - 5 WBC/hpf   RBC / HPF TOO NUMEROUS TO COUNT 0 - 5 RBC/hpf   Bacteria, UA RARE (A) NONE SEEN   Imaging Review Ct Renal Stone Study  10/04/2015  CLINICAL DATA:  Initial valuation for acute left-sided flank pain. EXAM: CT ABDOMEN AND PELVIS WITHOUT CONTRAST TECHNIQUE: Multidetector CT imaging of the abdomen and pelvis was performed following the standard protocol without IV contrast. COMPARISON:  Prior study from 08/05/2006 FINDINGS: Mild bibasilar atelectatic changes present. Visualized lungs are otherwise clear. Subcentimeter subpleural nodular density within the right middle lobe is stable from prior, likely benign. Limited noncontrast evaluation of the liver is  unremarkable. Gallbladder normal. No biliary dilatation. Spleen, adrenal glands, and pancreas demonstrate a normal unenhanced appearance. Right kidney unremarkable without evidence nephrolithiasis or hydronephrosis. No radiopaque stones seen along the course of the right renal collecting system. There is no right-sided hydroureter. On the left, there is an obstructive 4 mm stone lodged at the left UVJ with secondary mild left hydroureteronephrosis. Left perinephric and periureteral fat stranding present. No other stones seen within the left kidney or along the course of the left ureter. Stomach within normal limits. No evidence for bowel obstruction. Appendix is normal. No acute inflammatory changes about the bowels. Bladder within normal limits.  Uterus and ovaries normal. No free air or fluid. No pathologically enlarged intra-abdominal or pelvic lymph nodes identified. Scattered aorto bi-iliac atherosclerotic disease. No aneurysm. No acute osseous abnormality. No worrisome lytic or blastic osseous lesions. IMPRESSION: 1. Obstructive 4 mm stone at the left UVJ with secondary mild left hydroureteronephrosis. 2. No other acute intra-abdominal or pelvic process. Electronically Signed   By: Jeannine Boga M.D.   On: 10/04/2015 [id number]     Delora Fuel, MD has personally reviewed and evaluated these images and lab results as part of his medical decision-making.   MDM   Final diagnoses:  Left flank pain  Ureterolithiasis  Polycythemia  Type 2 diabetes mellitus without complication, without long-term current use of insulin (HCC)    Flank pain of uncertain cause. Need to consider urolithiasis, urinary tract infection with pyelonephritis, abdominal aortic aneurysm, diverticulitis. Old records reviewed, and she had an abdominal ultrasound in January 2016 at which time no abdominal aneurysm was seen and no kidney stone was seen. She has had a CT of abdomen and pelvis in 2008 with no evidence of aneurysm or  kidney stone. She is given IV  fluids, morphine, ondansetron with moderate relief of pain. CT scan shows 4 mm calculus in the distal left ureter with no residual renal calculi seen. She was given ketorolac and metoclopramide with excellent relief of symptoms. Incidental finding of blood sugar of 291. I talked with patient about this and she states that she had been told that she was borderline diabetic. I told her I wanted to prescribe metformin, and she told me that she actually has had at home but has not taken it for several months. She is advised to resume taking it and to see her PCP regarding care of her diabetes. She is discharged with prescriptions for tamsulosin, oxycodone-acetaminophen, and metoclopramide. She is referred to [facility] for follow-up of her kidney stone.  I personally performed the services described in this documentation, which was scribed in my presence. The recorded information has been reviewed and is accurate.      Delora Fuel, MD [id number]

## 2015-10-04 NOTE — ED Notes (Signed)
Pt aware that a urine sample is needed.  Pt sts she hasn't voided much today and is unable at this time.

## 2015-10-04 NOTE — Discharge Instructions (Signed)
Resume taking your metformin. Take it twice a day, every day. Make an appointment with your primary care provider. You need to have some additional evaluation regarding your diabetes, and also need to start monitoring her blood sugars at home.  Kidney Stones Kidney stones (urolithiasis) are deposits that form inside your kidneys. The intense pain is caused by the stone moving through the urinary tract. When the stone moves, the ureter goes into spasm around the stone. The stone is usually passed in the urine.  CAUSES   A disorder that makes certain neck glands produce too much parathyroid hormone (primary hyperparathyroidism).  A buildup of uric acid crystals, similar to gout in your joints.  Narrowing (stricture) of the ureter.  A kidney obstruction present at birth (congenital obstruction).  Previous surgery on the kidney or ureters.  Numerous kidney infections. SYMPTOMS   Feeling sick to your stomach (nauseous).  Throwing up (vomiting).  Blood in the urine (hematuria).  Pain that usually spreads (radiates) to the groin.  Frequency or urgency of urination. DIAGNOSIS   Taking a history and physical exam.  Blood or urine tests.  CT scan.  Occasionally, an examination of the inside of the urinary bladder (cystoscopy) is performed. TREATMENT   Observation.  Increasing your fluid intake.  Extracorporeal shock wave lithotripsy--This is a noninvasive procedure that uses shock waves to break up kidney stones.  Surgery may be needed if you have severe pain or persistent obstruction. There are various surgical procedures. Most of the procedures are performed with the use of small instruments. Only small incisions are needed to accommodate these instruments, so recovery time is minimized. The size, location, and chemical composition are all important variables that will determine the proper choice of action for you. Talk to your health care provider to better understand your  situation so that you will minimize the risk of injury to yourself and your kidney.  HOME CARE INSTRUCTIONS   Drink enough water and fluids to keep your urine clear or pale yellow. This will help you to pass the stone or stone fragments.  Strain all urine through the provided strainer. Keep all particulate matter and stones for your health care provider to see. The stone causing the pain may be as small as a grain of salt. It is very important to use the strainer each and every time you pass your urine. The collection of your stone will allow your health care provider to analyze it and verify that a stone has actually passed. The stone analysis will often identify what you can do to reduce the incidence of recurrences.  Only take over-the-counter or prescription medicines for pain, discomfort, or fever as directed by your health care provider.  Keep all follow-up visits as told by your health care provider. This is important.  Get follow-up X-rays if required. The absence of pain does not always mean that the stone has passed. It may have only stopped moving. If the urine remains completely obstructed, it can cause loss of kidney function or even complete destruction of the kidney. It is your responsibility to make sure X-rays and follow-ups are completed. Ultrasounds of the kidney can show blockages and the status of the kidney. Ultrasounds are not associated with any radiation and can be performed easily in a matter of minutes.  Make changes to your daily diet as told by your health care provider. You may be told to:  Limit the amount of salt that you eat.  Eat 5 or more servings  of fruits and vegetables each day.  Limit the amount of meat, poultry, fish, and eggs that you eat.  Collect a 24-hour urine sample as told by your health care provider.You may need to collect another urine sample every 6-12 months. SEEK MEDICAL CARE IF:  You experience pain that is progressive and unresponsive to  any pain medicine you have been prescribed. SEEK IMMEDIATE MEDICAL CARE IF:   Pain cannot be controlled with the prescribed medicine.  You have a fever or shaking chills.  The severity or intensity of pain increases over 18 hours and is not relieved by pain medicine.  You develop a new onset of abdominal pain.  You feel faint or pass out.  You are unable to urinate.   This information is not intended to replace advice given to you by your health care provider. Make sure you discuss any questions you have with your health care provider.   Document Released: 05/31/2005 Document Revised: 02/19/2015 Document Reviewed: 11/01/2012 Elsevier Interactive Patient Education 2016 [employer]  Type 2 Diabetes Mellitus, Adult Type 2 diabetes mellitus, often simply referred to as type 2 diabetes, is a long-lasting (chronic) disease. In type 2 diabetes, the pancreas does not make enough insulin (a hormone), the cells are less responsive to the insulin that is made (insulin resistance), or both. Normally, insulin moves sugars from food into the tissue cells. The tissue cells use the sugars for energy. The lack of insulin or the lack of normal response to insulin causes excess sugars to build up in the blood instead of going into the tissue cells. As a result, high blood sugar (hyperglycemia) develops. The effect of high sugar (glucose) levels can cause many complications. Type 2 diabetes was also previously called adult-onset diabetes, but it can occur at any age.  RISK FACTORS  A person is predisposed to developing type 2 diabetes if someone in the family has the disease and also has one or more of the following primary risk factors:  Weight gain, or being overweight or obese.  An inactive lifestyle.  A history of consistently eating high-calorie foods. Maintaining a normal weight and regular physical activity can reduce the chance of developing type 2 diabetes. SYMPTOMS  A person with type 2  diabetes may not show symptoms initially. The symptoms of type 2 diabetes appear slowly. The symptoms include:  Increased thirst (polydipsia).  Increased urination (polyuria).  Increased urination during the night (nocturia).  Sudden or unexplained weight changes.  Frequent, recurring infections.  Tiredness (fatigue).  Weakness.  Vision changes, such as blurred vision.  Fruity smell to your breath.  Abdominal pain.  Nausea or vomiting.  Cuts or bruises which are slow to heal.  Tingling or numbness in the hands or feet.  An open skin wound (ulcer). DIAGNOSIS Type 2 diabetes is frequently not diagnosed until complications of diabetes are present. Type 2 diabetes is diagnosed when symptoms or complications are present and when blood glucose levels are increased. Your blood glucose level may be checked by one or more of the following blood tests:  A fasting blood glucose test. You will not be allowed to eat for at least 8 hours before a blood sample is taken.  A random blood glucose test. Your blood glucose is checked at any time of the day regardless of when you ate.  A hemoglobin A1c blood glucose test. A hemoglobin A1c test provides information about blood glucose control over the previous 3 months.  An oral glucose tolerance test (OGTT).  Your blood glucose is measured after you have not eaten (fasted) for 2 hours and then after you drink a glucose-containing beverage. TREATMENT   You may need to take insulin or diabetes medicine daily to keep blood glucose levels in the desired range.  If you use insulin, you may need to adjust the dosage depending on the carbohydrates that you eat with each meal or snack.  Lifestyle changes are recommended as part of your treatment. These may include:  Following an individualized diet plan developed by a nutritionist or dietitian.  Exercising daily. Your health care providers will set individualized treatment goals for you based on  your age, your medicines, how long you have had diabetes, and any other medical conditions you have. Generally, the goal of treatment is to maintain the following blood glucose levels:  Before meals (preprandial): 80-130 mg/dL.  After meals (postprandial): below 180 mg/dL.  A1c: less than 6.5-7%. HOME CARE INSTRUCTIONS   Have your hemoglobin A1c level checked twice a year.  Perform daily blood glucose monitoring as directed by your health care provider.  Monitor urine ketones when you are ill and as directed by your health care provider.  Take your diabetes medicine or insulin as directed by your health care provider to maintain your blood glucose levels in the desired range.  Never run out of diabetes medicine or insulin. It is needed every day.  If you are using insulin, you may need to adjust the amount of insulin given based on your intake of carbohydrates. Carbohydrates can raise blood glucose levels but need to be included in your diet. Carbohydrates provide vitamins, minerals, and fiber which are an essential part of a healthy diet. Carbohydrates are found in fruits, vegetables, whole grains, dairy products, legumes, and foods containing added sugars.  Eat healthy foods. You should make an appointment to see a registered dietitian to help you create an eating plan that is right for you.  Lose weight if you are overweight.  Carry a medical alert card or wear your medical alert jewelry.  Carry a 15-gram carbohydrate snack with you at all times to treat low blood glucose (hypoglycemia). Some examples of 15-gram carbohydrate snacks include:  Glucose tablets, 3 or 4.  Glucose gel, 15-gram tube.  Raisins, 2 tablespoons (24 grams).  Jelly beans, 6.  Animal crackers, 8.  Regular pop, 4 ounces (120 mL).  Gummy treats, 9.  Recognize hypoglycemia. Hypoglycemia occurs with blood glucose levels of 70 mg/dL and below. The risk for hypoglycemia increases when fasting or skipping  meals, during or after intense exercise, and during sleep. Hypoglycemia symptoms can include:  Tremors or shakes.  Decreased ability to concentrate.  Sweating.  Increased heart rate.  Headache.  Dry mouth.  Hunger.  Irritability.  Anxiety.  Restless sleep.  Altered speech or coordination.  Confusion.  Treat hypoglycemia promptly. If you are alert and able to safely swallow, follow the 15:15 rule:  Take 15-20 grams of rapid-acting glucose or carbohydrate. Rapid-acting options include glucose gel, glucose tablets, or 4 ounces (120 mL) of fruit juice, regular soda, or low-fat milk.  Check your blood glucose level 15 minutes after taking the glucose.  Take 15-20 grams more of glucose if the repeat blood glucose level is still 70 mg/dL or below.  Eat a meal or snack within 1 hour once blood glucose levels return to normal.  Be alert to feeling very thirsty and urinating more frequently than usual, which are early signs of hyperglycemia. An early awareness of  hyperglycemia allows for prompt treatment. Treat hyperglycemia as directed by your health care provider.  Engage in at least 150 minutes of moderate-intensity physical activity a week, spread over at least 3 days of the week or as directed by your health care provider. In addition, you should engage in resistance exercise at least 2 times a week or as directed by your health care provider. Try to spend no more than 90 minutes at one time inactive.  Adjust your medicine and food intake as needed if you start a new exercise or sport.  Follow your sick-day plan anytime you are unable to eat or drink as usual.  Do not use any tobacco products including cigarettes, chewing tobacco, or electronic cigarettes. If you need help quitting, ask your health care provider.  Limit alcohol intake to no more than 1 drink per day for nonpregnant women and 2 drinks per day for men. You should drink alcohol only when you are also eating  food. Talk with your health care provider whether alcohol is safe for you. Tell your health care provider if you drink alcohol several times a week.  Keep all follow-up visits as directed by your health care provider. This is important.  Schedule an eye exam soon after the diagnosis of type 2 diabetes and then annually.  Perform daily skin and foot care. Examine your skin and feet daily for cuts, bruises, redness, nail problems, bleeding, blisters, or sores. A foot exam by a health care provider should be done annually.  Brush your teeth and gums at least twice a day and floss at least once a day. Follow up with your dentist regularly.  Share your diabetes management plan with your workplace or school.  Keep your immunizations up to date. It is recommended that you receive a flu (influenza) vaccine every year. It is also recommended that you receive a pneumonia (pneumococcal) vaccine. If you are 70 years of age or older and have never received a pneumonia vaccine, this vaccine may be given as a series of two separate shots. Ask your health care provider which additional vaccines may be recommended.  Learn to manage stress.  Obtain ongoing diabetes education and support as needed.  Participate in or seek rehabilitation as needed to maintain or improve independence and quality of life. Request a physical or occupational therapy referral if you are having foot or hand numbness, or difficulties with grooming, dressing, eating, or physical activity. SEEK MEDICAL CARE IF:   You are unable to eat food or drink fluids for more than 6 hours.  You have nausea and vomiting for more than 6 hours.  Your blood glucose level is over 240 mg/dL.  There is a change in mental status.  You develop an additional serious illness.  You have diarrhea for more than 6 hours.  You have been sick or have had a fever for a couple of days and are not getting better.  You have pain during any physical activity.   SEEK IMMEDIATE MEDICAL CARE IF:  You have difficulty breathing.  You have moderate to large ketone levels.   This information is not intended to replace advice given to you by your health care provider. Make sure you discuss any questions you have with your health care provider.   Document Released: 05/31/2005 Document Revised: 02/19/2015 Document Reviewed: 12/28/2011 Elsevier Interactive Patient Education 2016 [employer].  Tamsulosin capsules What is this medicine? TAMSULOSIN (tam SOO loe sin) is used to treat enlargement of the prostate gland  in men, a condition called benign prostatic hyperplasia or BPH. It is not for use in women. It works by relaxing muscles in the prostate and bladder neck. This improves urine flow and reduces BPH symptoms. This medicine may be used for other purposes; ask your health care provider or pharmacist if you have questions. What should I tell my health care provider before I take this medicine? They need to know if you have any of the following conditions: -advanced kidney disease -advanced liver disease -low blood pressure -prostate cancer -an unusual or allergic reaction to tamsulosin, sulfa drugs, other medicines, foods, dyes, or preservatives -pregnant or trying to get pregnant -breast-feeding How should I use this medicine? Take this medicine by mouth about 30 minutes after the same meal every day. Follow the directions on the prescription label. Swallow the capsules whole with a glass of water. Do not crush, chew, or open capsules. Do not take your medicine more often than directed. Do not stop taking your medicine unless your doctor tells you to. Talk to your pediatrician regarding the use of this medicine in children. Special care may be needed. Overdosage: If you think you have taken too much of this medicine contact a poison control center or emergency room at once. NOTE: This medicine is only for you. Do not share this medicine with  others. What if I miss a dose? If you miss a dose, take it as soon as you can. If it is almost time for your next dose, take only that dose. Do not take double or extra doses. If you stop taking your medicine for several days or more, ask your doctor or health care professional what dose you should start back on. What may interact with this medicine? -cimetidine -fluoxetine -ketoconazole -medicines for erectile disfunction like sildenafil, tadalafil, vardenafil -medicines for high blood pressure -other alpha-blockers like alfuzosin, doxazosin, phentolamine, phenoxybenzamine, prazosin, terazosin -warfarin This list may not describe all possible interactions. Give your health care provider a list of all the medicines, herbs, non-prescription drugs, or dietary supplements you use. Also tell them if you smoke, drink alcohol, or use illegal drugs. Some items may interact with your medicine. What should I watch for while using this medicine? Visit your doctor or health care professional for regular check ups. You will need lab work done before you start this medicine and regularly while you are taking it. Check your blood pressure as directed. Ask your health care professional what your blood pressure should be, and when you should contact him or her. This medicine may make you feel dizzy or lightheaded. This is more likely to happen after the first dose, after an increase in dose, or during hot weather or exercise. Drinking alcohol and taking some medicines can make this worse. Do not drive, use machinery, or do anything that needs mental alertness until you know how this medicine affects you. Do not sit or stand up quickly. If you begin to feel dizzy, sit down until you feel better. These effects can decrease once your body adjusts to the medicine. Contact your doctor or health care professional right away if you have an erection that lasts longer than 4 hours or if it becomes painful. This may be a sign  of a serious problem and must be treated right away to prevent permanent damage. If you are thinking of having cataract surgery, tell your eye surgeon that you have taken this medicine. What side effects may I notice from receiving this medicine? Side effects  that you should report to your doctor or health care professional as soon as possible: -allergic reactions like skin rash or itching, hives, swelling of the lips, mouth, tongue, or throat -breathing problems -change in vision -feeling faint or lightheaded -irregular heartbeat -prolonged or painful erection -weakness Side effects that usually do not require medical attention (report to your doctor or health care professional if they continue or are bothersome): -back pain -change in sex drive or performance -constipation, nausea or vomiting -cough -drowsy -runny or stuffy nose -trouble sleeping This list may not describe all possible side effects. Call your doctor for medical advice about side effects. You may report side effects to FDA at 1-800-FDA-1088. Where should I keep my medicine? Keep out of the reach of children. Store at room temperature between 15 and 30 degrees C (59 and 86 degrees F). Throw away any unused medicine after the expiration date. NOTE: This sheet is a summary. It may not cover all possible information. If you have questions about this medicine, talk to your doctor, pharmacist, or health care provider.    2016, [employer]vier/[employer]. (2012-05-31 14:11:34)  Metoclopramide tablets What is this medicine? METOCLOPRAMIDE (met oh kloe PRA mide) is used to treat the symptoms of gastroesophageal reflux disease (GERD) like heartburn. It is also used to treat people with slow emptying of the stomach and intestinal tract. This medicine may be used for other purposes; ask your health care provider or pharmacist if you have questions. What should I tell my health care provider before I take this medicine? They need to  know if you have any of these conditions: -breast cancer -depression -diabetes -heart failure -high blood pressure -kidney disease -liver disease -Parkinson's disease or a movement disorder -pheochromocytoma -seizures -stomach obstruction, bleeding, or perforation -an unusual or allergic reaction to metoclopramide, procainamide, sulfites, other medicines, foods, dyes, or preservatives -pregnant or trying to get pregnant -breast-feeding How should I use this medicine? Take this medicine by mouth with a glass of water. Follow the directions on the prescription label. Take this medicine on an empty stomach, about 30 minutes before eating. Take your doses at regular intervals. Do not take your medicine more often than directed. Do not stop taking except on the advice of your doctor or health care professional. A special MedGuide will be given to you by the pharmacist with each prescription and refill. Be sure to read this information carefully each time. Talk to your pediatrician regarding the use of this medicine in children. Special care may be needed. Overdosage: If you think you have taken too much of this medicine contact a poison control center or emergency room at once. NOTE: This medicine is only for you. Do not share this medicine with others. What if I miss a dose? If you miss a dose, take it as soon as you can. If it is almost time for your next dose, take only that dose. Do not take double or extra doses. What may interact with this medicine? -acetaminophen -cyclosporine -digoxin -medicines for blood pressure -medicines for diabetes, including insulin -medicines for hay fever and other allergies -medicines for depression, especially an Monoamine Oxidase Inhibitor (MAOI) -medicines for Parkinson's disease, like levodopa -medicines for sleep or for pain -tetracycline This list may not describe all possible interactions. Give your health care provider a list of all the  medicines, herbs, non-prescription drugs, or dietary supplements you use. Also tell them if you smoke, drink alcohol, or use illegal drugs. Some items may interact with your medicine.  What should I watch for while using this medicine? It may take a few weeks for your stomach condition to start to get better. However, do not take this medicine for longer than 12 weeks. The longer you take this medicine, and the more you take it, the greater your chances are of developing serious side effects. If you are an elderly patient, a female patient, or you have diabetes, you may be at an increased risk for side effects from this medicine. Contact your doctor immediately if you start having movements you cannot control such as lip smacking, rapid movements of the tongue, involuntary or uncontrollable movements of the eyes, head, arms and legs, or muscle twitches and spasms. Patients and their families should watch out for worsening depression or thoughts of suicide. Also watch out for any sudden or severe changes in feelings such as feeling anxious, agitated, panicky, irritable, hostile, aggressive, impulsive, severely restless, overly excited and hyperactive, or not being able to sleep. If this happens, especially at the beginning of treatment or after a change in dose, call your doctor. Do not treat yourself for high fever. Ask your doctor or health care professional for advice. You may get drowsy or dizzy. Do not drive, use machinery, or do anything that needs mental alertness until you know how this drug affects you. Do not stand or sit up quickly, especially if you are an older patient. This reduces the risk of dizzy or fainting spells. Alcohol can make you more drowsy and dizzy. Avoid alcoholic drinks. What side effects may I notice from receiving this medicine? Side effects that you should report to your doctor or health care professional as soon as possible: -allergic reactions like skin rash, itching or hives,  swelling of the face, lips, or tongue -abnormal production of milk in females -breast enlargement in both males and females -change in the way you walk -difficulty moving, speaking or swallowing -drooling, lip smacking, or rapid movements of the tongue -excessive sweating -fever -involuntary or uncontrollable movements of the eyes, head, arms and legs -irregular heartbeat or palpitations -muscle twitches and spasms -unusually weak or tired Side effects that usually do not require medical attention (report to your doctor or health care professional if they continue or are bothersome): -change in sex drive or performance -depressed mood -diarrhea -difficulty sleeping -headache -menstrual changes -restless or nervous This list may not describe all possible side effects. Call your doctor for medical advice about side effects. You may report side effects to FDA at 1-800-FDA-1088. Where should I keep my medicine? Keep out of the reach of children. Store at room temperature between 20 and 25 degrees C (68 and 77 degrees F). Protect from light. Keep container tightly closed. Throw away any unused medicine after the expiration date. NOTE: This sheet is a summary. It may not cover all possible information. If you have questions about this medicine, talk to your doctor, pharmacist, or health care provider.    2016, Elsevier/Gold Standard. (2011-09-28 13:04:38)  Acetaminophen; Oxycodone tablets What is this medicine? ACETAMINOPHEN; OXYCODONE (a set a MEE noe fen; ox i KOE done) is a pain reliever. It is used to treat moderate to severe pain. This medicine may be used for other purposes; ask your health care provider or pharmacist if you have questions. What should I tell my health care provider before I take this medicine? They need to know if you have any of these conditions: -brain tumor -Crohn's disease, inflammatory bowel disease, or ulcerative colitis -drug abuse or  addiction -head  injury -heart or circulation problems -if you often drink alcohol -kidney disease or problems going to the bathroom -liver disease -lung disease, asthma, or breathing problems -an unusual or allergic reaction to acetaminophen, oxycodone, other opioid analgesics, other medicines, foods, dyes, or preservatives -pregnant or trying to get pregnant -breast-feeding How should I use this medicine? Take this medicine by mouth with a full glass of water. Follow the directions on the prescription label. You can take it with or without food. If it upsets your stomach, take it with food. Take your medicine at regular intervals. Do not take it more often than directed. Talk to your pediatrician regarding the use of this medicine in children. Special care may be needed. Patients over 75 years old may have a stronger reaction and need a smaller dose. Overdosage: If you think you have taken too much of this medicine contact a poison control center or emergency room at once. NOTE: This medicine is only for you. Do not share this medicine with others. What if I miss a dose? If you miss a dose, take it as soon as you can. If it is almost time for your next dose, take only that dose. Do not take double or extra doses. What may interact with this medicine? -alcohol -antihistamines -barbiturates like amobarbital, butalbital, butabarbital, methohexital, pentobarbital, phenobarbital, thiopental, and secobarbital -benztropine -drugs for bladder problems like solifenacin, trospium, oxybutynin, tolterodine, hyoscyamine, and methscopolamine -drugs for breathing problems like ipratropium and tiotropium -drugs for certain stomach or intestine problems like propantheline, homatropine methylbromide, glycopyrrolate, atropine, belladonna, and dicyclomine -general anesthetics like etomidate, ketamine, nitrous oxide, propofol, desflurane, enflurane, halothane, isoflurane, and sevoflurane -medicines for depression, anxiety, or  psychotic disturbances -medicines for sleep -muscle relaxants -naltrexone -narcotic medicines (opiates) for pain -phenothiazines like perphenazine, thioridazine, chlorpromazine, mesoridazine, fluphenazine, prochlorperazine, promazine, and trifluoperazine -scopolamine -tramadol -trihexyphenidyl This list may not describe all possible interactions. Give your health care provider a list of all the medicines, herbs, non-prescription drugs, or dietary supplements you use. Also tell them if you smoke, drink alcohol, or use illegal drugs. Some items may interact with your medicine. What should I watch for while using this medicine? Tell your doctor or health care professional if your pain does not go away, if it gets worse, or if you have new or a different type of pain. You may develop tolerance to the medicine. Tolerance means that you will need a higher dose of the medication for pain relief. Tolerance is normal and is expected if you take this medicine for a long time. Do not suddenly stop taking your medicine because you may develop a severe reaction. Your body becomes used to the medicine. This does NOT mean you are addicted. Addiction is a behavior related to getting and using a drug for a non-medical reason. If you have pain, you have a medical reason to take pain medicine. Your doctor will tell you how much medicine to take. If your doctor wants you to stop the medicine, the dose will be slowly lowered over time to avoid any side effects. You may get drowsy or dizzy. Do not drive, use machinery, or do anything that needs mental alertness until you know how this medicine affects you. Do not stand or sit up quickly, especially if you are an older patient. This reduces the risk of dizzy or fainting spells. Alcohol may interfere with the effect of this medicine. Avoid alcoholic drinks. There are different types of narcotic medicines (opiates) for pain. If you take  more than one type at the same time, you  may have more side effects. Give your health care provider a list of all medicines you use. Your doctor will tell you how much medicine to take. Do not take more medicine than directed. Call emergency for help if you have problems breathing. The medicine will cause constipation. Try to have a bowel movement at least every 2 to 3 days. If you do not have a bowel movement for 3 days, call your doctor or health care professional. Do not take Tylenol (acetaminophen) or medicines that have acetaminophen with this medicine. Too much acetaminophen can be very dangerous. Many nonprescription medicines contain acetaminophen. Always read the labels carefully to avoid taking more acetaminophen. What side effects may I notice from receiving this medicine? Side effects that you should report to your doctor or health care professional as soon as possible: -allergic reactions like skin rash, itching or hives, swelling of the face, lips, or tongue -breathing difficulties, wheezing -confusion -light headedness or fainting spells -severe stomach pain -unusually weak or tired -yellowing of the skin or the whites of the eyes Side effects that usually do not require medical attention (report to your doctor or health care professional if they continue or are bothersome): -dizziness -drowsiness -nausea -vomiting This list may not describe all possible side effects. Call your doctor for medical advice about side effects. You may report side effects to FDA at 1-800-FDA-1088. Where should I keep my medicine? Keep out of the reach of children. This medicine can be abused. Keep your medicine in a safe place to protect it from theft. Do not share this medicine with anyone. Selling or giving away this medicine is dangerous and against the law. This medicine may cause accidental overdose and death if it taken by other adults, children, or pets. Mix any unused medicine with a substance like cat litter or coffee grounds. Then  throw the medicine away in a sealed container like a sealed bag or a coffee can with a lid. Do not use the medicine after the expiration date. Store at room temperature between 20 and 25 degrees C (68 and 77 degrees F). NOTE: This sheet is a summary. It may not cover all possible information. If you have questions about this medicine, talk to your doctor, pharmacist, or health care provider.    2016, Elsevier/Gold Standard. (2014-05-01 15:18:46)

## 2015-10-05 LAB — URINE CULTURE

## 2015-10-06 ENCOUNTER — Encounter (HOSPITAL_COMMUNITY): Payer: Self-pay | Admitting: Emergency Medicine

## 2015-10-06 ENCOUNTER — Emergency Department (HOSPITAL_COMMUNITY)
Admission: EM | Admit: 2015-10-06 | Discharge: 2015-10-07 | Disposition: A | Discharge status: Home / Self Care | Payer: BLUE CROSS/BLUE SHIELD | Attending: Emergency Medicine | Admitting: Emergency Medicine

## 2015-10-06 DIAGNOSIS — N23 Unspecified renal colic: Secondary | ICD-10-CM | POA: Insufficient documentation

## 2015-10-06 DIAGNOSIS — F329 Major depressive disorder, single episode, unspecified: Secondary | ICD-10-CM | POA: Diagnosis not present

## 2015-10-06 DIAGNOSIS — G8929 Other chronic pain: Secondary | ICD-10-CM | POA: Diagnosis not present

## 2015-10-06 DIAGNOSIS — F17210 Nicotine dependence, cigarettes, uncomplicated: Secondary | ICD-10-CM | POA: Diagnosis not present

## 2015-10-06 DIAGNOSIS — Z8619 Personal history of other infectious and parasitic diseases: Secondary | ICD-10-CM | POA: Insufficient documentation

## 2015-10-06 DIAGNOSIS — M1990 Unspecified osteoarthritis, unspecified site: Secondary | ICD-10-CM | POA: Insufficient documentation

## 2015-10-06 DIAGNOSIS — R109 Unspecified abdominal pain: Secondary | ICD-10-CM | POA: Diagnosis present

## 2015-10-06 DIAGNOSIS — F419 Anxiety disorder, unspecified: Secondary | ICD-10-CM | POA: Insufficient documentation

## 2015-10-06 DIAGNOSIS — Z79899 Other long term (current) drug therapy: Secondary | ICD-10-CM | POA: Insufficient documentation

## 2015-10-06 NOTE — ED Notes (Signed)
Pt states she was here on Friday night for left flank pain and had a CT that showed she had a kidney stone  Pt is c/o left flank pain and nausea now  Pt states she is having a difficult time voiding  Pt states she was voiding okay this morning but throughout the day it has become less and less

## 2015-10-07 LAB — URINE MICROSCOPIC-ADD ON

## 2015-10-07 LAB — BASIC METABOLIC PANEL
Anion gap: 6 (ref 5–15)
BUN: 12 mg/dL (ref 6–20)
CALCIUM: 9.7 mg/dL (ref 8.9–10.3)
CO2: 26 mmol/L (ref 22–32)
CREATININE: 0.93 mg/dL (ref 0.44–1.00)
Chloride: 107 mmol/L (ref 101–111)
GFR calc Af Amer: >60 mL/min (ref >60)
Glucose, Bld: 207 mg/dL — ABNORMAL HIGH (ref 65–99)
Potassium: 4.7 mmol/L (ref 3.5–5.1)
SODIUM: 139 mmol/L (ref 135–145)

## 2015-10-07 LAB — URINALYSIS, ROUTINE W REFLEX MICROSCOPIC
BILIRUBIN URINE: NEGATIVE
Glucose, UA: 100 mg/dL — AB
Ketones, ur: NEGATIVE mg/dL
Leukocytes, UA: NEGATIVE
Nitrite: NEGATIVE
PROTEIN: NEGATIVE mg/dL
Specific Gravity, Urine: 1.015 (ref 1.005–1.030)
pH: 6 (ref 5.0–8.0)

## 2015-10-07 MED ORDER — KETOROLAC TROMETHAMINE 60 MG/2ML IM SOLN
60.0000 mg | Freq: Once | INTRAMUSCULAR | Status: AC
  Administered 2015-10-07: 60 mg via INTRAMUSCULAR
  Filled 2015-10-07: qty 2

## 2015-10-07 NOTE — Discharge Instructions (Signed)
Please read and follow all provided instructions.  Your diagnoses today include:  1. Ureteral colic     Tests performed today include:  Urine test that showed blood in your urine and no definite infection  Urine culture - pending  Blood test that showed normal kidney function  Vital signs. See below for your results today.   Medications prescribed:   None  Take any prescribed medications only as directed.  Home care instructions:  Follow any educational materials contained in this packet.  Please double your fluid intake for the next several days. Strain your urine and save any stones that may pass.   Use home medications as prescribed at initial visit.   Follow-up instructions: Please follow-up with your urologist as planned.  Return instructions:  If you need to return to the Emergency Department, go to [facility] and not [facility]. The urologists are located at [facility] and can better care for you at this location.   Please return to the Emergency Department if you experience worsening symptoms.  Please return if you develop fever or uncontrolled pain or vomiting.  Please return if you have any other emergent concerns.  Additional Information:  Your vital signs today were: BP 158/94 mmHg   Pulse 93   Temp(Src) 97.9 F (36.6 C) (Oral)   Resp 16   Ht 5\' 6"  (1.676 m)   Wt 99.791 kg   BMI 35.53 kg/m2   SpO2 98% If your blood pressure (BP) was elevated above 135/85 this visit, please have this repeated by your doctor within one month. --------------

## 2015-10-07 NOTE — ED Provider Notes (Signed)
CSN: [id number]     Arrival date & time 10/06/15  2214 History   First MD Initiated Contact with Patient 10/07/15 0014     Chief Complaint  Patient presents with  . Flank Pain     (Consider location/radiation/quality/duration/timing/severity/associated sxs/prior Treatment) HPI Comments: Patient seen in emergency department on 10/04/2015 and diagnosed with a 4 mm obstructing ureteral stone at the left UVJ with mild left hydroureteronephrosis -- presents with intermittent left flank pain. Pain was worse tonight prompting emergency department evaluation. Patient was concerned that her kidneys are being damaged by the stone. She has not passed any stones. Pain is not nearly as severe as it was on initial evaluation. Patient states that she has been taking Flomax. She has also been taking pain medication but states that she did not want to become dependent upon this so she did not take it this evening or when the pain became worse. Patient no longer notices hematuria. She's not had fever, vomiting, or diarrhea. She has an appointment with her primary care physician at 9 AM today and a urology appointment in 3 days. Onset of symptoms acute. Course is waxing and waning. Nothing makes symptoms better or worse.   The history is provided by the patient.    Past Medical History  Diagnosis Date  . Arthritis   . Anxiety   . Depression   . Hepatitis C dx 2014    [facility]  . Chronic pain    Past Surgical History  Procedure Laterality Date  . Knee arthroscopy w/ acl reconstruction      right knee x 3  . Transthoracic echocardiogram  2014    normal   Family History  Problem Relation Age of Onset  . Colon polyps Father    Social History  Substance Use Topics  . Smoking status: Current Every Day Smoker -- 0.50 packs/day    Types: Cigarettes  . Smokeless tobacco: Never Used  . Alcohol Use: No     Comment: rarely once a year wine   OB History    No data available     Review of  Systems  Constitutional: Negative for fever.  HENT: Negative for rhinorrhea and sore throat.   Eyes: Negative for redness.  Respiratory: Negative for cough.   Cardiovascular: Negative for chest pain.  Gastrointestinal: Negative for nausea, vomiting, abdominal pain and diarrhea.  Genitourinary: Positive for flank pain. Negative for dysuria and hematuria.  Musculoskeletal: Negative for myalgias.  Skin: Negative for rash.  Neurological: Negative for headaches.      Allergies  Review of patient's allergies indicates no known allergies.  Home Medications   Prior to Admission medications   Medication Sig Start Date End Date Taking? Authorizing Provider  baclofen (LIORESAL) 10 MG tablet Take 20 mg by mouth at bedtime.    Yes Historical Provider, MD  cyclobenzaprine (FLEXERIL) 10 MG tablet Take 10 mg by mouth at bedtime.    Yes Historical Provider, MD  gabapentin (NEURONTIN) 300 MG capsule Take 300 mg by mouth 3 (three) times daily.   Yes Historical Provider, MD  metoCLOPramide (REGLAN) 10 MG tablet Take 1 tablet (10 mg total) by mouth every 6 (six) hours. 123456  Yes Delora Fuel, MD  Naproxen-Esomeprazole (VIMOVO) 500-20 MG TBEC Take 1 tablet by mouth every 8 (eight) hours.   Yes Historical Provider, MD  oxyCODONE-acetaminophen (PERCOCET) 5-325 MG tablet Take 1 tablet by mouth every 4 (four) hours as needed for moderate pain. 123456  Yes Delora Fuel,  MD  tamsulosin (FLOMAX) 0.4 MG CAPS capsule Take 1 capsule (0.4 mg total) by mouth daily. 123456  Yes Delora Fuel, MD   BP XX123456 mmHg  Pulse 93  Temp(Src) 97.9 F (36.6 C) (Oral)  Resp 16  Ht 5\' 6"  (1.676 m)  Wt 99.791 kg  BMI 35.53 kg/m2  SpO2 98% Physical Exam  Constitutional: She appears well-developed and well-nourished.  HENT:  Head: Normocephalic and atraumatic.  Eyes: Conjunctivae are normal. Right eye exhibits no discharge. Left eye exhibits no discharge.  Neck: Normal range of motion. Neck supple.  Cardiovascular:  Normal rate, regular rhythm and normal heart sounds.   Pulmonary/Chest: Effort normal and breath sounds normal.  Abdominal: Soft. There is no tenderness. There is no rebound, no guarding and no CVA tenderness.  Neurological: She is alert.  Skin: Skin is warm and dry.  Psychiatric: She has a normal mood and affect.  Nursing note and vitals reviewed.   ED Course  Procedures (including critical care time) Labs Review Labs Reviewed  BASIC METABOLIC PANEL - Abnormal; Notable for the following:    Glucose, Bld 207 (*)    All other components within normal limits  URINALYSIS, ROUTINE W REFLEX MICROSCOPIC (NOT AT [facility]) - Abnormal; Notable for the following:    Glucose, UA 100 (*)    Hgb urine dipstick LARGE (*)    All other components within normal limits  URINE MICROSCOPIC-ADD ON - Abnormal; Notable for the following:    Squamous Epithelial / LPF 0-5 (*)    Bacteria, UA FEW (*)    All other components within normal limits  URINE CULTURE    Patient seen and examined. Work-up initiated. Medications ordered.   Vital signs reviewed and are as follows: BP 158/94 mmHg  Pulse 93  Temp(Src) 97.9 F (36.6 C) (Oral)  Resp 16  Ht 5\' 6"  (1.676 m)  Wt 99.791 kg  BMI 35.53 kg/m2  SpO2 98%  Patient updated on results. Her pain is currently controlled. She is requesting discharge to home. She will follow-up with her primary care physician today and with her urologist in 3 days. Patient encouraged to return to the emergency department with worsening, uncontrolled pain, fever, changing symptoms or other concerns. She verbalizes understanding and agrees with plan.   MDM   Final diagnoses:  Ureteral colic   Patient with previously diagnosed ureteral stone. Pain controlled in the emergency department with IM Toradol. Kidney function is stable. Patient does not have clinical signs of urinary tract infection, however she does have some white blood cells in her urine. Culture sent. I would not treat  at this time. Patient has appropriate follow-up. She will continue her home medications.    Carlisle Cater, PA-C 10/07/15 [place], MD 10/10/15 [phone]

## 2015-10-08 LAB — URINE CULTURE

## 2016-05-04 ENCOUNTER — Other Ambulatory Visit: Payer: Self-pay | Admitting: Internal Medicine

## 2016-05-04 DIAGNOSIS — R1011 Right upper quadrant pain: Secondary | ICD-10-CM

## 2016-05-05 ENCOUNTER — Ambulatory Visit
Admission: RE | Admit: 2016-05-05 | Discharge: 2016-05-05 | Disposition: A | Discharge status: Home / Self Care | Payer: BLUE CROSS/BLUE SHIELD | Source: Ambulatory Visit | Attending: Internal Medicine | Admitting: Internal Medicine

## 2016-05-05 DIAGNOSIS — R1011 Right upper quadrant pain: Secondary | ICD-10-CM

## 2019-01-23 DIAGNOSIS — Z87891 Personal history of nicotine dependence: Secondary | ICD-10-CM | POA: Insufficient documentation

## 2019-01-23 DIAGNOSIS — Z8673 Personal history of transient ischemic attack (TIA), and cerebral infarction without residual deficits: Secondary | ICD-10-CM | POA: Insufficient documentation

## 2019-01-23 DIAGNOSIS — K219 Gastro-esophageal reflux disease without esophagitis: Secondary | ICD-10-CM | POA: Insufficient documentation

## 2019-04-08 ENCOUNTER — Emergency Department (HOSPITAL_COMMUNITY): Payer: BC Managed Care – PPO

## 2019-04-08 ENCOUNTER — Other Ambulatory Visit: Payer: Self-pay

## 2019-04-08 ENCOUNTER — Emergency Department (HOSPITAL_COMMUNITY)
Admission: EM | Admit: 2019-04-08 | Discharge: 2019-04-09 | Disposition: A | Discharge status: Home / Self Care | Payer: BC Managed Care – PPO | Attending: Emergency Medicine | Admitting: Emergency Medicine

## 2019-04-08 ENCOUNTER — Encounter (HOSPITAL_COMMUNITY): Payer: Self-pay

## 2019-04-08 DIAGNOSIS — R05 Cough: Secondary | ICD-10-CM | POA: Insufficient documentation

## 2019-04-08 DIAGNOSIS — M7918 Myalgia, other site: Secondary | ICD-10-CM | POA: Insufficient documentation

## 2019-04-08 DIAGNOSIS — J069 Acute upper respiratory infection, unspecified: Secondary | ICD-10-CM | POA: Diagnosis not present

## 2019-04-08 DIAGNOSIS — R079 Chest pain, unspecified: Secondary | ICD-10-CM | POA: Diagnosis not present

## 2019-04-08 DIAGNOSIS — Z20828 Contact with and (suspected) exposure to other viral communicable diseases: Secondary | ICD-10-CM | POA: Insufficient documentation

## 2019-04-08 DIAGNOSIS — F17210 Nicotine dependence, cigarettes, uncomplicated: Secondary | ICD-10-CM | POA: Diagnosis not present

## 2019-04-08 DIAGNOSIS — R058 Other specified cough: Secondary | ICD-10-CM

## 2019-04-08 DIAGNOSIS — R509 Fever, unspecified: Secondary | ICD-10-CM | POA: Diagnosis not present

## 2019-04-08 DIAGNOSIS — Z79899 Other long term (current) drug therapy: Secondary | ICD-10-CM | POA: Diagnosis not present

## 2019-04-08 DIAGNOSIS — R0602 Shortness of breath: Secondary | ICD-10-CM

## 2019-04-08 LAB — COMPREHENSIVE METABOLIC PANEL
ALT: 70 U/L — ABNORMAL HIGH (ref 0–44)
AST: 37 U/L (ref 15–41)
Albumin: 4.5 g/dL (ref 3.5–5.0)
Alkaline Phosphatase: 68 U/L (ref 38–126)
Anion gap: 10 (ref 5–15)
BUN: 12 mg/dL (ref 6–20)
CO2: 21 mmol/L — ABNORMAL LOW (ref 22–32)
Calcium: 9.2 mg/dL (ref 8.9–10.3)
Chloride: 107 mmol/L (ref 98–111)
Creatinine, Ser: 0.71 mg/dL (ref 0.44–1.00)
GFR calc Af Amer: >60 mL/min (ref >60)
GFR calc non Af Amer: >60 mL/min (ref >60)
Glucose, Bld: 167 mg/dL — ABNORMAL HIGH (ref 70–99)
Potassium: 3.9 mmol/L (ref 3.5–5.1)
Sodium: 138 mmol/L (ref 135–145)
Total Bilirubin: 0.8 mg/dL (ref 0.3–1.2)
Total Protein: 7.3 g/dL (ref 6.5–8.1)

## 2019-04-08 LAB — CBC WITH DIFFERENTIAL/PLATELET
Abs Immature Granulocytes: 0.03 10*3/uL (ref 0.00–0.07)
Basophils Absolute: 0 10*3/uL (ref 0.0–0.1)
Basophils Relative: 0 %
Eosinophils Absolute: 0 10*3/uL (ref 0.0–0.5)
Eosinophils Relative: 1 %
HCT: 47.5 % — ABNORMAL HIGH (ref 36.0–46.0)
Hemoglobin: 15.7 g/dL — ABNORMAL HIGH (ref 12.0–15.0)
Immature Granulocytes: 0 %
Lymphocytes Relative: 12 %
Lymphs Abs: 1 10*3/uL (ref 0.7–4.0)
MCH: 30 pg (ref 26.0–34.0)
MCHC: 33.1 g/dL (ref 30.0–36.0)
MCV: 90.6 fL (ref 80.0–100.0)
Monocytes Absolute: 0.5 10*3/uL (ref 0.1–1.0)
Monocytes Relative: 6 %
Neutro Abs: 6.8 10*3/uL (ref 1.7–7.7)
Neutrophils Relative %: 81 %
Platelets: 241 10*3/uL (ref 150–400)
RBC: 5.24 MIL/uL — ABNORMAL HIGH (ref 3.87–5.11)
RDW: 12.6 % (ref 11.5–15.5)
WBC: 8.3 10*3/uL (ref 4.0–10.5)
nRBC: 0 % (ref 0.0–0.2)

## 2019-04-08 LAB — URINALYSIS, ROUTINE W REFLEX MICROSCOPIC
Bacteria, UA: NONE SEEN
Bilirubin Urine: NEGATIVE
Glucose, UA: NEGATIVE mg/dL
Ketones, ur: NEGATIVE mg/dL
Leukocytes,Ua: NEGATIVE
Nitrite: NEGATIVE
Protein, ur: NEGATIVE mg/dL
Specific Gravity, Urine: 1.017 (ref 1.005–1.030)
pH: 6 (ref 5.0–8.0)

## 2019-04-08 LAB — LACTIC ACID, PLASMA: Lactic Acid, Venous: 1.8 mmol/L (ref 0.5–1.9)

## 2019-04-08 MED ORDER — SODIUM CHLORIDE 0.9% FLUSH
3.0000 mL | Freq: Once | INTRAVENOUS | Status: AC
  Administered 2019-04-08: 3 mL via INTRAVENOUS

## 2019-04-08 MED ORDER — SODIUM CHLORIDE 0.9 % IV SOLN
500.0000 mg | Freq: Once | INTRAVENOUS | Status: AC
  Administered 2019-04-09: 500 mg via INTRAVENOUS
  Filled 2019-04-08: qty 500

## 2019-04-08 MED ORDER — SODIUM CHLORIDE 0.9 % IV SOLN
1.0000 g | Freq: Once | INTRAVENOUS | Status: AC
  Administered 2019-04-08: 1 g via INTRAVENOUS
  Filled 2019-04-08: qty 10

## 2019-04-08 MED ORDER — DOXYCYCLINE HYCLATE 100 MG PO CAPS: 100.0000 mg | ORAL_CAPSULE | Freq: Two times a day (BID) | ORAL | 0 refills | Status: DC

## 2019-04-08 MED ORDER — ACETAMINOPHEN 500 MG PO TABS
1000.0000 mg | ORAL_TABLET | Freq: Once | ORAL | Status: AC
Start: 2019-04-08 — End: 2019-04-08
  Administered 2019-04-08: 1000 mg via ORAL
  Filled 2019-04-08: qty 2

## 2019-04-08 NOTE — ED Provider Notes (Signed)
[facility] DEPT Provider Note   CSN: [id number] Arrival date & time: 04/08/19  1952     History   Chief Complaint Chief Complaint  Patient presents with  . Chest Pain  . Fever  . Cough    HPI Sara Banks is a 58 y.o. female.     Patient c/o fever, productive cough, body aches, for past 2 days. Symptoms acute onset, moderate, persistent, constant. States feels similar to when had pna in past. No specific known ill contacts. No sore throat or trouble swallowing. No runny nose. No known covid+ exposure, reports negative test 2 months ago. Denies neck pain or stiffness. No chest pain or discomfort. No leg pain or swelling. No abd pain or nvd. No dysuria or gu c/o.   The history is provided by the patient.  Chest Pain Associated symptoms: cough and fever   Associated symptoms: no abdominal pain, no headache, no shortness of breath and no vomiting   Fever Associated symptoms: chest pain, cough and myalgias   Associated symptoms: no chills, no confusion, no diarrhea, no dysuria, no headaches, no rash, no sore throat and no vomiting   Cough Associated symptoms: chest pain, fever and myalgias   Associated symptoms: no chills, no headaches, no rash, no shortness of breath and no sore throat     Past Medical History:  Diagnosis Date  . Anxiety   . Arthritis   . Chronic pain   . Depression   . Hepatitis C dx 2014   [facility]    Patient Active Problem List   Diagnosis Date Noted  . Acute ischemic stroke of right pons 05/11/2013  . Chronic hepatitis C ([place]) 05/11/2013  . Tobacco abuse 05/11/2013  . Obesity, unspecified 05/11/2013  . Chronic back pain 05/11/2013    Past Surgical History:  Procedure Laterality Date  . KNEE ARTHROSCOPY W/ ACL RECONSTRUCTION     right knee x 3  . TRANSTHORACIC ECHOCARDIOGRAM  2014   normal     OB History   No obstetric history on file.      Home Medications    Prior to Admission  medications   Medication Sig Start Date End Date Taking? Authorizing Provider  baclofen (LIORESAL) 10 MG tablet Take 20 mg by mouth at bedtime.     [provider]  cyclobenzaprine (FLEXERIL) 10 MG tablet Take 10 mg by mouth at bedtime.     [provider]  gabapentin (NEURONTIN) 300 MG capsule Take 300 mg by mouth 3 (three) times daily.    [provider]  metoCLOPramide (REGLAN) 10 MG tablet Take 1 tablet (10 mg total) by mouth every 6 (six) hours. 123456   Delora Fuel, MD  Naproxen-Esomeprazole (VIMOVO) 500-20 MG TBEC Take 1 tablet by mouth every 8 (eight) hours.    [provider]  oxyCODONE-acetaminophen (PERCOCET) 5-325 MG tablet Take 1 tablet by mouth every 4 (four) hours as needed for moderate pain. 123456   Delora Fuel, MD  tamsulosin (FLOMAX) 0.4 MG CAPS capsule Take 1 capsule (0.4 mg total) by mouth daily. 123456   Delora Fuel, MD  atorvastatin (LIPITOR) 80 MG tablet Take 1 tablet (80 mg total) by mouth daily at 6 PM. Patient not taking: Reported on 12/10/2014 05/12/13 10/04/15  Coral Spikes, DO    Family History Family History  Problem Relation Age of Onset  . Colon polyps Father     Social History Social History   Tobacco Use  .  Smoking status: Current Every Day Smoker    Packs/day: 0.50    Types: Cigarettes  . Smokeless tobacco: Never Used  Substance Use Topics  . Alcohol use: No    Comment: rarely once a year wine  . Drug use: No     Allergies   Patient has no known allergies.   Review of Systems Review of Systems  Constitutional: Positive for fever. Negative for chills.  HENT: Negative for sore throat.   Eyes: Negative for redness.  Respiratory: Positive for cough. Negative for shortness of breath.   Cardiovascular: Positive for chest pain. Negative for leg swelling.  Gastrointestinal: Negative for abdominal pain, diarrhea and vomiting.  Endocrine: Negative for polyuria.  Genitourinary: Negative for dysuria and  flank pain.  Musculoskeletal: Positive for myalgias. Negative for neck pain and neck stiffness.  Skin: Negative for rash.  Neurological: Negative for headaches.  Hematological: Does not bruise/bleed easily.  Psychiatric/Behavioral: Negative for confusion.     Physical Exam Updated Vital Signs BP (!) 160/80 (BP Location: Right Arm)   Pulse (!) 124   Temp 99.5 F (37.5 C) (Oral)   Resp (!) 22   Ht 1.676 m (5\' 6" )   Wt 95.3 kg   SpO2 94%   BMI 33.89 kg/m   Physical Exam Vitals signs and nursing note reviewed.  Constitutional:      Appearance: Normal appearance. She is well-developed.  HENT:     Head: Atraumatic.     Nose: Nose normal.     Mouth/Throat:     Mouth: Mucous membranes are moist.  Eyes:     General: No scleral icterus.    Conjunctiva/sclera: Conjunctivae normal.     Pupils: Pupils are equal, round, and reactive to light.  Neck:     Musculoskeletal: Normal range of motion and neck supple. No neck rigidity or muscular tenderness.     Trachea: No tracheal deviation.     Comments: No stiffness or rigidity Cardiovascular:     Rate and Rhythm: Normal rate and regular rhythm.     Pulses: Normal pulses.     Heart sounds: Normal heart sounds. No murmur. No friction rub. No gallop.   Pulmonary:     Effort: Pulmonary effort is normal. No respiratory distress.     Comments: ?minimal rales left base.  Abdominal:     General: Bowel sounds are normal. There is no distension.     Palpations: Abdomen is soft.     Tenderness: There is no abdominal tenderness. There is no guarding.  Genitourinary:    Comments: No cva tenderness.  Musculoskeletal:        General: No swelling or tenderness.     Right lower leg: No edema.     Left lower leg: No edema.  Lymphadenopathy:     Cervical: No cervical adenopathy.  Skin:    General: Skin is warm and dry.     Findings: No rash.  Neurological:     Mental Status: She is alert.     Comments: Alert, speech normal.   Psychiatric:         Mood and Affect: Mood normal.      ED Treatments / Results  Labs (all labs ordered are listed, but only abnormal results are displayed) Results for orders placed or performed during the hospital encounter of 04/08/19  CBC with Differential  Result Value Ref Range   WBC 8.3 4.0 - 10.5 K/uL   RBC 5.24 (H) 3.87 - 5.11 MIL/uL   Hemoglobin 15.7 (  H) 12.0 - 15.0 g/dL   HCT 47.5 (H) 36.0 - 46.0 %   MCV 90.6 80.0 - 100.0 fL   MCH 30.0 26.0 - 34.0 pg   MCHC 33.1 30.0 - 36.0 g/dL   RDW 12.6 11.5 - 15.5 %   Platelets 241 150 - 400 K/uL   nRBC 0.0 0.0 - 0.2 %   Neutrophils Relative % 81 %   Neutro Abs 6.8 1.7 - 7.7 K/uL   Lymphocytes Relative 12 %   Lymphs Abs 1.0 0.7 - 4.0 K/uL   Monocytes Relative 6 %   Monocytes Absolute 0.5 0.1 - 1.0 K/uL   Eosinophils Relative 1 %   Eosinophils Absolute 0.0 0.0 - 0.5 K/uL   Basophils Relative 0 %   Basophils Absolute 0.0 0.0 - 0.1 K/uL   Immature Granulocytes 0 %   Abs Immature Granulocytes 0.03 0.00 - 0.07 K/uL   Dg Chest Port 1 View  Result Date: 04/08/2019 CLINICAL DATA:  Shortness of breath, cough, body aches and fever EXAM: PORTABLE CHEST 1 VIEW COMPARISON:  Chest radiograph 05/11/2013 FINDINGS: Bandlike areas of opacity compatible with atelectasis and/or scarring given the presence on comparison imaging from 2014. Lung volumes are slightly low. Accounting for body habitus, the lungs are clear. No pneumothorax or effusion. Cardiomediastinal contours over the expected normals. No acute osseous or soft tissue abnormality. IMPRESSION: Bandlike areas of subsegmental atelectasis and/or scarring. Lungs are otherwise clear. Electronically Signed   By: Lovena Le M.D.   On: 04/08/2019 20:36    EKG None  Radiology Dg Chest Port 1 View  Result Date: 04/08/2019 CLINICAL DATA:  Shortness of breath, cough, body aches and fever EXAM: PORTABLE CHEST 1 VIEW COMPARISON:  Chest radiograph 05/11/2013 FINDINGS: Bandlike areas of opacity compatible  with atelectasis and/or scarring given the presence on comparison imaging from 2014. Lung volumes are slightly low. Accounting for body habitus, the lungs are clear. No pneumothorax or effusion. Cardiomediastinal contours over the expected normals. No acute osseous or soft tissue abnormality. IMPRESSION: Bandlike areas of subsegmental atelectasis and/or scarring. Lungs are otherwise clear. Electronically Signed   By: Lovena Le M.D.   On: 04/08/2019 20:36    Procedures Procedures (including critical care time)  Medications Ordered in ED Medications  sodium chloride flush (NS) 0.9 % injection 3 mL (has no administration in time range)  acetaminophen (TYLENOL) tablet 1,000 mg (has no administration in time range)     Initial Impression / Assessment and Plan / ED Course  I have reviewed the triage vital signs and the nursing notes.  Pertinent labs & imaging results that were available during my care of the patient were reviewed by me and considered in my medical decision making (see chart for details).  Labs and cxr.   Reviewed nursing notes and prior charts for additional history.   CXR reviewed/interpreted by me - atelectasis/scarring, no definite pna.   Po fluids, acetaminophen po.   Doxycycline po.   Will send covid swab. Discussed precautions w pt.   Sara Banks was evaluated in Emergency Department on 04/08/2019 for the symptoms described in the history of present illness. She was evaluated in the context of the global COVID-19 pandemic, which necessitated consideration that the patient might be at risk for infection with the SARS-CoV-2 virus that causes COVID-19. Institutional protocols and algorithms that pertain to the evaluation of patients at risk for COVID-19 are in a state of rapid change based on information released by regulatory bodies including the CDC  and federal and state organizations. These policies and algorithms were followed during the patient's care in the  ED.  Patient currently appears stable for d/c. Is breathing comfortably, o2 sats 94-95% room air.   Return precautions provided.     Final Clinical Impressions(s) / ED Diagnoses   Final diagnoses:  SOB (shortness of breath)    ED Discharge Orders    None       Lajean Saver, MD 04/09/19 1544

## 2019-04-08 NOTE — ED Triage Notes (Signed)
Patient states she is having a fever, cough, chills, weakness, and mid chest pain. Patient states this started last night.

## 2019-04-08 NOTE — Discharge Instructions (Addendum)
It was our pleasure to provide your ER care today - we hope that you feel better.  Take antibiotic as prescribed. Take acetaminophen or ibuprofen as need for fever/aches.   Your covid test will be resulted in the next 2 days - you may check My [facility], or call back for results. See covid instructions, and quarantine instructions.  Return to ER if worse, new symptoms, increased trouble breathing, or other concern.

## 2019-04-10 LAB — NOVEL CORONAVIRUS, NAA (HOSP ORDER, SEND-OUT TO REF LAB; TAT 18-24 HRS): SARS-CoV-2, NAA: NOT DETECTED

## 2019-06-28 ENCOUNTER — Encounter: Payer: Self-pay | Admitting: Family Medicine

## 2019-07-12 ENCOUNTER — Other Ambulatory Visit: Payer: Self-pay

## 2019-07-13 ENCOUNTER — Encounter: Payer: Self-pay | Admitting: Family Medicine

## 2019-07-13 ENCOUNTER — Ambulatory Visit (INDEPENDENT_AMBULATORY_CARE_PROVIDER_SITE_OTHER): Payer: Managed Care, Other (non HMO) | Admitting: Family Medicine

## 2019-07-13 VITALS — BP 110/78 | HR 83 | Temp 96.8°F | Ht 66.0 in | Wt 204.0 lb

## 2019-07-13 DIAGNOSIS — I10 Essential (primary) hypertension: Secondary | ICD-10-CM

## 2019-07-13 DIAGNOSIS — Z7689 Persons encountering health services in other specified circumstances: Secondary | ICD-10-CM

## 2019-07-13 DIAGNOSIS — Z0000 Encounter for general adult medical examination without abnormal findings: Secondary | ICD-10-CM | POA: Diagnosis not present

## 2019-07-13 DIAGNOSIS — E1149 Type 2 diabetes mellitus with other diabetic neurological complication: Secondary | ICD-10-CM

## 2019-07-13 DIAGNOSIS — Z6832 Body mass index (BMI) 32.0-32.9, adult: Secondary | ICD-10-CM

## 2019-07-13 DIAGNOSIS — F419 Anxiety disorder, unspecified: Secondary | ICD-10-CM

## 2019-07-13 DIAGNOSIS — Z720 Tobacco use: Secondary | ICD-10-CM | POA: Diagnosis not present

## 2019-07-13 DIAGNOSIS — E039 Hypothyroidism, unspecified: Secondary | ICD-10-CM | POA: Diagnosis not present

## 2019-07-13 DIAGNOSIS — Z23 Encounter for immunization: Secondary | ICD-10-CM

## 2019-07-13 DIAGNOSIS — Z5320 Procedure and treatment not carried out because of patient's decision for unspecified reasons: Secondary | ICD-10-CM

## 2019-07-13 DIAGNOSIS — E669 Obesity, unspecified: Secondary | ICD-10-CM

## 2019-07-13 DIAGNOSIS — Z2821 Immunization not carried out because of patient refusal: Secondary | ICD-10-CM

## 2019-07-13 LAB — URINALYSIS
Bilirubin Urine: NEGATIVE
Hgb urine dipstick: NEGATIVE
Leukocytes,Ua: NEGATIVE
Nitrite: NEGATIVE
Specific Gravity, Urine: 1.025 (ref 1.000–1.030)
Total Protein, Urine: NEGATIVE
Urine Glucose: >=1000 — AB
Urobilinogen, UA: 0.2 (ref 0.0–1.0)
pH: 5.5 (ref 5.0–8.0)

## 2019-07-13 LAB — T4, FREE: Free T4: 0.72 ng/dL (ref 0.60–1.60)

## 2019-07-13 LAB — AST: AST: 27 U/L (ref 0–37)

## 2019-07-13 LAB — BASIC METABOLIC PANEL
BUN: 15 mg/dL (ref 6–23)
CO2: 28 mEq/L (ref 19–32)
Calcium: 9.6 mg/dL (ref 8.4–10.5)
Chloride: 99 mEq/L (ref 96–112)
Creatinine, Ser: 0.85 mg/dL (ref 0.40–1.20)
GFR: 68.02 mL/min (ref >60.00)
Glucose, Bld: 298 mg/dL — ABNORMAL HIGH (ref 70–99)
Potassium: 4.8 mEq/L (ref 3.5–5.1)
Sodium: 137 mEq/L (ref 135–145)

## 2019-07-13 LAB — VITAMIN D 25 HYDROXY (VIT D DEFICIENCY, FRACTURES): VITD: 22.44 ng/mL — ABNORMAL LOW (ref 30.00–100.00)

## 2019-07-13 LAB — ALT: ALT: 54 U/L — ABNORMAL HIGH (ref 0–35)

## 2019-07-13 LAB — LDL CHOLESTEROL, DIRECT: Direct LDL: 37 mg/dL

## 2019-07-13 LAB — TSH: TSH: 4.61 u[IU]/mL — ABNORMAL HIGH (ref 0.35–4.50)

## 2019-07-13 LAB — LIPID PANEL
Cholesterol: 111 mg/dL (ref 0–200)
HDL: 48.6 mg/dL (ref >39.00)
NonHDL: 62.68
Total CHOL/HDL Ratio: 2
Triglycerides: 223 mg/dL — ABNORMAL HIGH (ref 0.0–149.0)
VLDL: 44.6 mg/dL — ABNORMAL HIGH (ref 0.0–40.0)

## 2019-07-13 LAB — MICROALBUMIN / CREATININE URINE RATIO
Creatinine,U: 183.7 mg/dL
Microalb Creat Ratio: 2.1 mg/g (ref 0.0–30.0)
Microalb, Ur: 3.8 mg/dL — ABNORMAL HIGH (ref 0.0–1.9)

## 2019-07-13 LAB — HEMOGLOBIN A1C: Hgb A1c MFr Bld: 10.5 % — ABNORMAL HIGH (ref 4.6–6.5)

## 2019-07-13 MED ORDER — LOSARTAN POTASSIUM 25 MG PO TABS: 25.0000 mg | ORAL_TABLET | Freq: Every day | ORAL | 3 refills | Status: DC

## 2019-07-13 MED ORDER — HYDROXYZINE HCL 25 MG PO TABS: 25.0000 mg | ORAL_TABLET | Freq: Every day | ORAL | 3 refills | Status: DC

## 2019-07-13 MED ORDER — TRULICITY 0.75 MG/0.5ML ~~LOC~~ SOAJ: 0.5000 mL | SUBCUTANEOUS | 3 refills | Status: DC

## 2019-07-13 MED ORDER — METFORMIN HCL 500 MG PO TABS: 500.0000 mg | ORAL_TABLET | Freq: Two times a day (BID) | ORAL | 3 refills | Status: DC

## 2019-07-13 NOTE — Patient Instructions (Addendum)
[facility], [place], [place] [address] Phone: [phone]  [facility] [address] [place], [place], [place] [address] Phone: [phone]    Health Maintenance, Female Adopting a healthy lifestyle and getting preventive care are important in promoting health and wellness. Ask your health care provider about:  The right schedule for you to have regular tests and exams.  Things you can do on your own to prevent diseases and keep yourself healthy. What should I know about diet, weight, and exercise? Eat a healthy diet   Eat a diet that includes plenty of vegetables, fruits, low-fat dairy products, and lean protein.  Do not eat a lot of foods that are high in solid fats, added sugars, or sodium. Maintain a healthy weight Body mass index (BMI) is used to identify weight problems. It estimates body fat based on height and weight. Your health care provider can help determine your BMI and help you achieve or maintain a healthy weight. Get regular exercise Get regular exercise. This is one of the most important things you can do for your health. Most adults should:  Exercise for at least 150 minutes each week. The exercise should increase your heart rate and make you sweat (moderate-intensity exercise).  Do strengthening exercises at least twice a week. This is in addition to the moderate-intensity exercise.  Spend less time sitting. Even light physical activity can be beneficial. Watch cholesterol and blood lipids Have your blood tested for lipids and cholesterol at 61 years of age, then have this test every 5 years. Have your cholesterol levels checked more often if:  Your lipid or cholesterol levels are high.  You are older than 61 years of age.  You are at high risk for heart disease. What should I know about cancer screening? Depending on your health history and family history, you may need to have cancer screening at various ages. This may  include screening for:  Breast cancer.  Cervical cancer.  Colorectal cancer.  Skin cancer.  Lung cancer. What should I know about heart disease, diabetes, and high blood pressure? Blood pressure and heart disease  High blood pressure causes heart disease and increases the risk of stroke. This is more likely to develop in people who have high blood pressure readings, are of African descent, or are overweight.  Have your blood pressure checked: ? Every 3-5 years if you are 20-47 years of age. ? Every year if you are 81 years old or older. Diabetes Have regular diabetes screenings. This checks your fasting blood sugar level. Have the screening done:  Once every three years after age 60 if you are at a normal weight and have a low risk for diabetes.  More often and at a younger age if you are overweight or have a high risk for diabetes. What should I know about preventing infection? Hepatitis B If you have a higher risk for hepatitis B, you should be screened for this virus. Talk with your health care provider to find out if you are at risk for hepatitis B infection. Hepatitis C Testing is recommended for:  Everyone born from [birth year] through 1967.  Anyone with known risk factors for hepatitis C. Sexually transmitted infections (STIs)  Get screened for STIs, including gonorrhea and chlamydia, if: ? You are sexually active and are younger than 61 years of age. ? You are older than 61 years of age and your health care provider tells you that you are at risk  for this type of infection. ? Your sexual activity has changed since you were last screened, and you are at increased risk for chlamydia or gonorrhea. Ask your health care provider if you are at risk.  Ask your health care provider about whether you are at high risk for HIV. Your health care provider may recommend a prescription medicine to help prevent HIV infection. If you choose to take medicine to prevent HIV, you should first  get tested for HIV. You should then be tested every 3 months for as long as you are taking the medicine. Pregnancy  If you are about to stop having your period (premenopausal) and you may become pregnant, seek counseling before you get pregnant.  Take 400 to 800 micrograms (mcg) of folic acid every day if you become pregnant.  Ask for birth control (contraception) if you want to prevent pregnancy. Osteoporosis and menopause Osteoporosis is a disease in which the bones lose minerals and strength with aging. This can result in bone fractures. If you are 68 years old or older, or if you are at risk for osteoporosis and fractures, ask your health care provider if you should:  Be screened for bone loss.  Take a calcium or vitamin D supplement to lower your risk of fractures.  Be given hormone replacement therapy (HRT) to treat symptoms of menopause. Follow these instructions at home: Lifestyle  Do not use any products that contain nicotine or tobacco, such as cigarettes, e-cigarettes, and chewing tobacco. If you need help quitting, ask your health care provider.  Do not use street drugs.  Do not share needles.  Ask your health care provider for help if you need support or information about quitting drugs. Alcohol use  Do not drink alcohol if: ? Your health care provider tells you not to drink. ? You are pregnant, may be pregnant, or are planning to become pregnant.  If you drink alcohol: ? Limit how much you use to 0-1 drink a day. ? Limit intake if you are breastfeeding.  Be aware of how much alcohol is in your drink. In the [place] one drink equals one 12 oz bottle of beer (355 mL), one 5 oz glass of wine (148 mL), or one 1 oz glass of hard liquor (44 mL). General instructions  Schedule regular health, dental, and eye exams.  Stay current with your vaccines.  Tell your health care provider if: ? You often feel depressed. ? You have ever been abused or do not feel safe at  home. Summary  Adopting a healthy lifestyle and getting preventive care are important in promoting health and wellness.  Follow your health care provider's instructions about healthy diet, exercising, and getting tested or screened for diseases.  Follow your health care provider's instructions on monitoring your cholesterol and blood pressure. This information is not intended to replace advice given to you by your health care provider. Make sure you discuss any questions you have with your health care provider. Document Revised: 05/24/2018 Document Reviewed: 05/24/2018 Elsevier Patient Education  2020 [employer].

## 2019-07-13 NOTE — Addendum Note (Signed)
Addended by: Darral Dash on: 07/13/2019 09:21 AM   Modules accepted: Orders

## 2019-07-13 NOTE — Progress Notes (Addendum)
Sara Banks is a 59 y.o. female  Chief Complaint  Patient presents with  . Establish Care    Pt would like to discuss diabetes medications and also would like to discuss cholesterol.  Pt refuses Flu injection today.  She also is due for a mammogram and eye exam.  She will receive the Tdap injection today.    HPI: Sara Banks is a 59 y.o. female here as a new pt to establish care with our office. Previous PCP Dr. Iona Beard Osei-Bonsu with [facility].  She is due for CPE, fasting labs.  She declines flu vaccine but agrees to Tdap. She declines pneumonia vaccine.  She declines mammogram.  Last PAP: last year with PCP's office as per pt Last mammo: pt has had 1 mammogram - pt declines Last colonoscopy: patient states she had colonoscopy last year but only documentation in chart is 06/2011 - Dr. Ardis Hughs with [facility] - due in 2023  Med refills needed today? See orders   Past Medical History:  Diagnosis Date  . Anxiety   . Arthritis   . Chronic pain   . Depression   . Hepatitis C dx 2014   [facility]    Past Surgical History:  Procedure Laterality Date  . KNEE ARTHROSCOPY W/ ACL RECONSTRUCTION     right knee x 3  . TRANSTHORACIC ECHOCARDIOGRAM  2014   normal    Social History   Socioeconomic History  . Marital status: Single    Spouse name: Not on file  . Number of children: Not on file  . Years of education: Not on file  . Highest education level: Not on file  Occupational History  . Not on file  Tobacco Use  . Smoking status: Current Every Day Smoker    Packs/day: 0.50    Types: Cigarettes  . Smokeless tobacco: Never Used  Substance and Sexual Activity  . Alcohol use: No    Comment: rarely once a year wine  . Drug use: No  . Sexual activity: Not Currently  Other Topics Concern  . Not on file  Social History Narrative  . Not on file   Social Determinants of Health   Financial Resource Strain:   . Difficulty of Paying Living  Expenses: Not on file  Food Insecurity:   . Worried About Charity fundraiser in the Last Year: Not on file  . Ran Out of Food in the Last Year: Not on file  Transportation Needs:   . Lack of Transportation (Medical): Not on file  . Lack of Transportation (Non-Medical): Not on file  Physical Activity:   . Days of Exercise per Week: Not on file  . Minutes of Exercise per Session: Not on file  Stress:   . Feeling of Stress : Not on file  Social Connections:   . Frequency of Communication with Friends and Family: Not on file  . Frequency of Social Gatherings with Friends and Family: Not on file  . Attends Religious Services: Not on file  . Active Member of Clubs or Organizations: Not on file  . Attends Archivist Meetings: Not on file  . Marital Status: Not on file  Intimate Partner Violence:   . Fear of Current or Ex-Partner: Not on file  . Emotionally Abused: Not on file  . Physically Abused: Not on file  . Sexually Abused: Not on file    Family History  Problem Relation Age of Onset  . Colon  polyps Father   . Cancer - Other Mother       There is no immunization history on file for this patient.  Outpatient Encounter Medications as of 07/13/2019  Medication Sig  . gabapentin (NEURONTIN) 300 MG capsule Take 300 mg by mouth 3 (three) times daily.  Marland Kitchen atorvastatin (LIPITOR) 80 MG tablet Take by mouth.  . baclofen (LIORESAL) 10 MG tablet Take 20 mg by mouth at bedtime.   . Cholecalciferol 25 MCG (1000 UT) tablet Take by mouth.  . cyclobenzaprine (FLEXERIL) 10 MG tablet Take 10 mg by mouth at bedtime.   Marland Kitchen doxycycline (VIBRAMYCIN) 100 MG capsule Take 1 capsule (100 mg total) by mouth 2 (two) times daily.  . DULoxetine (CYMBALTA) 30 MG capsule   . hydrOXYzine (ATARAX/VISTARIL) 25 MG tablet Take by mouth.  . levothyroxine (SYNTHROID) 50 MCG tablet Take 50 mcg by mouth daily.  Marland Kitchen losartan (COZAAR) 25 MG tablet Take 25 mg by mouth daily.  . metFORMIN (GLUCOPHAGE) 500 MG  tablet Take 500 mg by mouth 2 (two) times daily.  . metoCLOPramide (REGLAN) 10 MG tablet Take 1 tablet (10 mg total) by mouth every 6 (six) hours.  . Naproxen-Esomeprazole (VIMOVO) 500-20 MG TBEC Take 1 tablet by mouth every 8 (eight) hours.  Marland Kitchen oxyCODONE-acetaminophen (PERCOCET) 5-325 MG tablet Take 1 tablet by mouth every 4 (four) hours as needed for moderate pain.  . tamsulosin (FLOMAX) 0.4 MG CAPS capsule Take 1 capsule (0.4 mg total) by mouth daily.  . [DISCONTINUED] atorvastatin (LIPITOR) 80 MG tablet Take 1 tablet (80 mg total) by mouth daily at 6 PM. (Patient not taking: Reported on 12/10/2014)   No facility-administered encounter medications on file as of 07/13/2019.     ROS: Gen: no fever, chills  Skin: no rash, itching ENT: no ear pain, ear drainage, nasal congestion, rhinorrhea, sinus pressure, sore throat Eyes: no blurry vision, double vision Resp: no cough, wheeze,SOB Breast: no breast tenderness, no nipple discharge, no breast masses CV: no CP, palpitations, LE edema,  GI: no heartburn, n/v/d/c, abd pain GU: no dysuria, urgency, frequency, hematuria MSK: no joint pain, myalgias, back pain Neuro: no dizziness, headache, weakness Psych: no depression, + anxiety, some insomnia   No Known Allergies  BP 110/78 (BP Location: Right Arm, Patient Position: Sitting, Cuff Size: Normal)   Pulse 83   Temp (!) 96.8 F (36 C) (Temporal)   Ht 5\' 6"  (1.676 m)   Wt 204 lb (92.5 kg)   SpO2 96%   BMI 32.93 kg/m   Physical Exam  Constitutional: She is oriented to person, place, and time. She appears well-developed and well-nourished. No distress.  HENT:  Head: Normocephalic and atraumatic.  Right Ear: Tympanic membrane and ear canal normal.  Left Ear: Tympanic membrane and ear canal normal.  Nose: Nose normal.  Mouth/Throat: Oropharynx is clear and moist and mucous membranes are normal.  Eyes: Pupils are equal, round, and reactive to light. Conjunctivae are normal.  Neck: No  thyromegaly present.  Cardiovascular: Normal rate, regular rhythm, normal heart sounds and intact distal pulses.  No murmur heard. Pulmonary/Chest: Effort normal and breath sounds normal. No respiratory distress. She has no wheezes. She has no rhonchi.  Abdominal: Soft. Bowel sounds are normal. She exhibits no distension and no mass. There is no abdominal tenderness.  Musculoskeletal:        General: No edema.     Cervical back: Neck supple.  Lymphadenopathy:    She has no cervical adenopathy.  Neurological: She is  alert and oriented to person, place, and time. She exhibits normal muscle tone. Coordination normal.  Skin: Skin is warm and dry.  Psychiatric: She has a normal mood and affect. Her behavior is normal.     A/P:  1. Annual physical exam - pt will complete ROI form so we can obtain records from previous PCP - pt states UTD on PAP, declines mammo, states she had colonoscopy last year but only record in chart is from 2013 w/ f/u in 2023 - declines pneumovax, flu vaccines; Tdap today - discussed importance of regular CV exercise, healthy diet, adequate sleep - ALT - AST - Basic metabolic panel - VITAMIN D 25 Hydroxy (Vit-D Deficiency, Fractures) - Lipid panel - Urinalysis - next CPE in 1 year  2. Encounter to establish care with new doctor  3. Tobacco abuse - not interested in cessation at this time, will discuss at future OV  4. Type 2 diabetes mellitus with neurological complications (HCC) - unsure of last A1C result - Hemoglobin A1c - Microalbumin / creatinine urine ratio - Lipid panel - Basic metabolic panel Refill: - metFORMIN (GLUCOPHAGE) 500 MG tablet; Take 1 tablet (500 mg total) by mouth 2 (two) times daily.  Dispense: 180 tablet; Refill: 3 - pt was on samples of another 1x/wk injectable but ran out 2 wks ago, insurance does not cover so it is cost-prohibitive Rx: - Dulaglutide (TRULICITY) A999333 0000000 SOPN; Inject 0.5 mLs into the skin once a week.   Dispense: 2 mL; Refill: 3 - due for eye exam - gave pt info for 2 ophthalmologist groups to contact to schedule - cont ARB, pt Rx'd atorvastatin but states pill is too large so previous PCP Rx'd a small pink pill and she needs a refill of that. I explained I don't know medications/dosages by the size/shape/color of the tablet so I will have to wait to receive records from previous PCP before refilling - q69mo f/u  5. Influenza vaccination declined by patient  6. Hypothyroidism, unspecified type - currently taking levothyroxine 73mcg daily - T4, free - TSH  7. Essential hypertension - controlled, at goal - encouraged regular walking or other CV exercise - low sodium diet Refill: - losartan (COZAAR) 25 MG tablet; Take 1 tablet (25 mg total) by mouth daily.  Dispense: 90 tablet; Refill: 3  8. Class 1 obesity with serious comorbidity and body mass index (BMI) of 32.0 to 32.9 in adult, unspecified obesity type - consider nutrition referral or referral to [facility] - encouraged regular walking or other CV exercise - low carb, low fat diet advisable  9. Pneumococcal vaccination declined by patient  10. Screening mammography declined  11. Anxiety - stable, controlled Refill: - hydrOXYzine (ATARAX/VISTARIL) 25 MG tablet; Take 1 tablet (25 mg total) by mouth at bedtime.  Dispense: 90 tablet; Refill: 3   This visit occurred during the SARS-CoV-2 public health emergency.  Safety protocols were in place, including screening questions prior to the visit, additional usage of staff PPE, and extensive cleaning of exam room while observing appropriate contact time as indicated for disinfecting solutions.

## 2019-07-26 ENCOUNTER — Encounter: Payer: Self-pay | Admitting: Family Medicine

## 2019-07-26 ENCOUNTER — Telehealth (INDEPENDENT_AMBULATORY_CARE_PROVIDER_SITE_OTHER): Payer: Managed Care, Other (non HMO) | Admitting: Family Medicine

## 2019-07-26 DIAGNOSIS — E781 Pure hyperglyceridemia: Secondary | ICD-10-CM | POA: Diagnosis not present

## 2019-07-26 DIAGNOSIS — E559 Vitamin D deficiency, unspecified: Secondary | ICD-10-CM | POA: Insufficient documentation

## 2019-07-26 DIAGNOSIS — E1165 Type 2 diabetes mellitus with hyperglycemia: Secondary | ICD-10-CM

## 2019-07-26 DIAGNOSIS — E039 Hypothyroidism, unspecified: Secondary | ICD-10-CM

## 2019-07-26 MED ORDER — TRULICITY 0.75 MG/0.5ML ~~LOC~~ SOAJ: 0.5000 mL | SUBCUTANEOUS | 3 refills | Status: DC

## 2019-07-26 NOTE — Progress Notes (Signed)
Virtual Visit via Video Note  I connected with Sara Banks on 07/26/19 at 11:00 AM EST by a video enabled telemedicine application and verified that I am speaking with the correct person using two identifiers. Location patient: home Location provider: home office Persons participating in the virtual visit: patient, provider  I discussed the limitations of evaluation and management by telemedicine and the availability of in person appointments. The patient expressed understanding and agreed to proceed.  Chief Complaint  Patient presents with  . Labs Only    lab result  discussion.  Pt will give weight during call.     HPI: Sara Banks is a 59 y.o. female to discuss recent lab results. Vit D is low. Pt was taking 1000IU Vit D daily x 2 wks prior to labs. She increased to 3000IU daily since seeing lab result. Pt is Rx'd levothyroxine 34mcg daily and TSH was slightly elevated. Pt admits to not taking her levothyroxine consistently but agrees to start doing so. Pt A1C = 10.5. She is on metformin 500mg  BID. She was also on Bydureon but ran out about 3+ weeks prior to labs being done. Insurance approved trulicity which she has been taking for 1-2 wks. She notes feeling jittery and anxious since starting med, does not check BS.  Total and LDL cholesterol at goal but TG significantly elevated. Pt is on atorvastatin 80mg  daily. She notes room for improvement in regards to diet and exercise.  Lab Results  Component Value Date   HGBA1C 10.5 (H) 07/13/2019   Lab Results  Component Value Date   CHOL 111 07/13/2019   HDL 48.60 07/13/2019   LDLCALC 101 (H) 05/12/2013   LDLDIRECT 37.0 07/13/2019   TRIG 223.0 (H) 07/13/2019   CHOLHDL 2 07/13/2019    Lab Results  Component Value Date   TSH 4.61 (H) 07/13/2019   Component     Latest Ref Rng & Units 07/13/2019  VITD     30.00 - 100.00 ng/mL 22.44 (L)     Past Medical History:  Diagnosis Date  . Anxiety   . Arthritis   . Chronic  pain   . Depression   . Hepatitis C dx 2014   [facility]    Past Surgical History:  Procedure Laterality Date  . KNEE ARTHROSCOPY W/ ACL RECONSTRUCTION     right knee x 3  . TRANSTHORACIC ECHOCARDIOGRAM  2014   normal    Family History  Problem Relation Age of Onset  . Colon polyps Father   . Cancer - Other Mother     Social History   Tobacco Use  . Smoking status: Current Every Day Smoker    Packs/day: 0.50    Types: Cigarettes  . Smokeless tobacco: Never Used  Substance Use Topics  . Alcohol use: No    Comment: rarely once a year wine  . Drug use: No     Current Outpatient Medications:  .  atorvastatin (LIPITOR) 80 MG tablet, Take by mouth., Disp: , Rfl:  .  Cholecalciferol 25 MCG (1000 UT) tablet, Take by mouth., Disp: , Rfl:  .  Dulaglutide (TRULICITY) A999333 0000000 SOPN, Inject 0.5 mLs into the skin once a week., Disp: 2 mL, Rfl: 3 .  DULoxetine (CYMBALTA) 30 MG capsule, , Disp: , Rfl:  .  gabapentin (NEURONTIN) 300 MG capsule, Take 300 mg by mouth 3 (three) times daily., Disp: , Rfl:  .  hydrOXYzine (ATARAX/VISTARIL) 25 MG tablet, Take 1 tablet (25 mg total)  by mouth at bedtime., Disp: 90 tablet, Rfl: 3 .  levothyroxine (SYNTHROID) 50 MCG tablet, Take 50 mcg by mouth daily., Disp: , Rfl:  .  losartan (COZAAR) 25 MG tablet, Take 1 tablet (25 mg total) by mouth daily., Disp: 90 tablet, Rfl: 3 .  metFORMIN (GLUCOPHAGE) 500 MG tablet, Take 1 tablet (500 mg total) by mouth 2 (two) times daily., Disp: 180 tablet, Rfl: 3 .  doxycycline (VIBRAMYCIN) 100 MG capsule, Take 1 capsule (100 mg total) by mouth 2 (two) times daily. (Patient not taking: Reported on 07/26/2019), Disp: 14 capsule, Rfl: 0  No Known Allergies    ROS: See pertinent positives and negatives per HPI.   EXAM:  VITALS per patient if applicable: There were no vitals taken for this visit.   GENERAL: alert, oriented, appears well and in no acute distress  HEENT: atraumatic, conjunctiva  clear, no obvious abnormalities on inspection of external nose and ears  NECK: normal movements of the head and neck  LUNGS: on inspection no signs of respiratory distress, breathing rate appears normal, no obvious gross SOB, gasping or wheezing, no conversational dyspnea  CV: no obvious cyanosis  PSYCH/NEURO: pleasant and cooperative, speech and thought processing grossly intact   ASSESSMENT AND PLAN: 1. Uncontrolled type 2 diabetes mellitus with hyperglycemia (HCC) - A1C = 10.5 - pt has been out of previous med bydureon x 3+ wks prior to labs - cont metformin 500mg  BID - pt recently started on trulicity but notes some jitteriness associated with it. Pt will decide if she wants to continue with me to see if she adjusts to med or switch med - repeat A1C in 8 wks - cont statin, ARB  2. Hypothyroidism, unspecified type - pt will start taking levothyroxine 29mcg daily on daily basis - recheck TFTs in 8 wks  3. Vitamin D deficiency - cont with Vit D 3000IU daily  4. Hypertriglyceridemia - cont atorvastatin - pt will focus on low-fat diet and regular CV exercise to try to lower TG    I discussed the assessment and treatment plan with the patient. The patient was provided an opportunity to ask questions and all were answered. The patient agreed with the plan and demonstrated an understanding of the instructions.   The patient was advised to call back or seek an in-person evaluation if the symptoms worsen or if the condition fails to improve as anticipated.   Letta Median, DO

## 2019-07-26 NOTE — Patient Instructions (Signed)
Health Maintenance Due  Topic Date Due  . PNEUMOCOCCAL POLYSACCHARIDE VACCINE AGE 61-64 HIGH RISK  08/24/1960  . FOOT EXAM  08/24/1968  . OPHTHALMOLOGY EXAM  08/24/1968  . PAP SMEAR-Modifier  08/25/1979  . MAMMOGRAM  08/24/2008    Depression screen PHQ 2/9 12/10/2014  Decreased Interest 0  Down, Depressed, Hopeless 0  PHQ - 2 Score 0

## 2019-08-06 ENCOUNTER — Encounter: Payer: Self-pay | Admitting: Family Medicine

## 2019-08-06 ENCOUNTER — Other Ambulatory Visit: Payer: Self-pay

## 2019-08-06 DIAGNOSIS — E1165 Type 2 diabetes mellitus with hyperglycemia: Secondary | ICD-10-CM

## 2019-08-06 MED ORDER — TRULICITY 0.75 MG/0.5ML ~~LOC~~ SOAJ: 0.5000 mL | SUBCUTANEOUS | 3 refills | Status: DC

## 2019-08-08 ENCOUNTER — Encounter: Payer: Self-pay | Admitting: Family Medicine

## 2019-08-08 ENCOUNTER — Other Ambulatory Visit: Payer: Self-pay

## 2019-08-08 DIAGNOSIS — E1165 Type 2 diabetes mellitus with hyperglycemia: Secondary | ICD-10-CM

## 2019-08-08 MED ORDER — TRULICITY 0.75 MG/0.5ML ~~LOC~~ SOAJ: 0.5000 mL | SUBCUTANEOUS | 3 refills | Status: DC

## 2019-08-15 ENCOUNTER — Telehealth: Payer: Self-pay | Admitting: Family Medicine

## 2019-08-15 DIAGNOSIS — F419 Anxiety disorder, unspecified: Secondary | ICD-10-CM

## 2019-08-15 DIAGNOSIS — E1149 Type 2 diabetes mellitus with other diabetic neurological complication: Secondary | ICD-10-CM

## 2019-08-15 NOTE — Telephone Encounter (Signed)
Patient is calling and requesting a refill for all of her medications on file sent to [facility] [facility]. CB is [phone]

## 2019-08-16 MED ORDER — HYDROXYZINE HCL 25 MG PO TABS: 25.0000 mg | ORAL_TABLET | Freq: Every day | ORAL | 3 refills | Status: DC

## 2019-08-16 MED ORDER — LEVOTHYROXINE SODIUM 50 MCG PO TABS: 50.0000 ug | ORAL_TABLET | Freq: Every day | ORAL | 3 refills | Status: DC

## 2019-08-16 MED ORDER — HYDROXYZINE HCL 25 MG PO TABS: 25.0000 mg | ORAL_TABLET | Freq: Every day | ORAL | 0 refills | Status: DC

## 2019-08-16 MED ORDER — DULOXETINE HCL 30 MG PO CPEP: 30.0000 mg | ORAL_CAPSULE | Freq: Every day | ORAL | 3 refills | Status: DC

## 2019-08-16 MED ORDER — METFORMIN HCL 500 MG PO TABS: 500.0000 mg | ORAL_TABLET | Freq: Two times a day (BID) | ORAL | 3 refills | Status: DC

## 2019-08-16 NOTE — Telephone Encounter (Signed)
Pt is requesting all medication sent in to the mail order. Pt is out of the Hydroxyzine and requesting a short order be sent to [facility] on [address].  Please advise.

## 2019-08-16 NOTE — Telephone Encounter (Signed)
Done. Thank you.

## 2019-08-20 ENCOUNTER — Telehealth: Payer: Self-pay

## 2019-08-20 NOTE — Telephone Encounter (Signed)
-  Leah form [facility] was calling to clarify medication for pt, Hydroxyzine.  She wanted to know if the rx  for Hydroxyzine prescribed,  is to be Atarax or Vistaril.  Denny Peon said that it needs to be either one, per providers choice.  I informed her that Dr. Loletha Grayer is out of office until Wednesday and that I would sent her the message.  Once I get clarification, I will call them back/.  Call back # [phone] Ref # [id number]

## 2019-08-22 NOTE — Telephone Encounter (Signed)
Rx for vistaril 25mg  qHS for anxiety/insomnia

## 2019-09-09 ENCOUNTER — Other Ambulatory Visit: Payer: Self-pay | Admitting: Family Medicine

## 2019-09-09 DIAGNOSIS — F419 Anxiety disorder, unspecified: Secondary | ICD-10-CM

## 2019-09-10 NOTE — Telephone Encounter (Signed)
Last OV/VV 07/26/19 Last fill 08/16/19  #30/0

## 2019-12-02 ENCOUNTER — Other Ambulatory Visit: Payer: Self-pay | Admitting: Family Medicine

## 2019-12-02 DIAGNOSIS — E1165 Type 2 diabetes mellitus with hyperglycemia: Secondary | ICD-10-CM

## 2019-12-03 NOTE — Telephone Encounter (Signed)
Last OV 07/13/19 Last fill 08/08/19  #53ml/3

## 2020-02-29 ENCOUNTER — Other Ambulatory Visit: Payer: Self-pay | Admitting: Family Medicine

## 2020-02-29 DIAGNOSIS — E1165 Type 2 diabetes mellitus with hyperglycemia: Secondary | ICD-10-CM

## 2020-03-22 ENCOUNTER — Other Ambulatory Visit: Payer: Self-pay | Admitting: Family Medicine

## 2020-03-22 DIAGNOSIS — E1165 Type 2 diabetes mellitus with hyperglycemia: Secondary | ICD-10-CM

## 2020-03-23 ENCOUNTER — Encounter: Payer: Self-pay | Admitting: Family Medicine

## 2020-06-04 ENCOUNTER — Other Ambulatory Visit: Payer: Self-pay | Admitting: Family Medicine

## 2020-06-04 DIAGNOSIS — E1165 Type 2 diabetes mellitus with hyperglycemia: Secondary | ICD-10-CM

## 2020-08-14 ENCOUNTER — Ambulatory Visit (INDEPENDENT_AMBULATORY_CARE_PROVIDER_SITE_OTHER): Payer: 59 | Admitting: Family Medicine

## 2020-08-14 ENCOUNTER — Other Ambulatory Visit: Payer: Self-pay | Admitting: Family Medicine

## 2020-08-14 ENCOUNTER — Other Ambulatory Visit: Payer: Self-pay

## 2020-08-14 ENCOUNTER — Encounter: Payer: Self-pay | Admitting: Family Medicine

## 2020-08-14 VITALS — BP 120/82 | HR 80 | Temp 97.6°F | Ht 66.0 in | Wt 202.2 lb

## 2020-08-14 DIAGNOSIS — E559 Vitamin D deficiency, unspecified: Secondary | ICD-10-CM | POA: Diagnosis not present

## 2020-08-14 DIAGNOSIS — E1165 Type 2 diabetes mellitus with hyperglycemia: Secondary | ICD-10-CM

## 2020-08-14 DIAGNOSIS — I10 Essential (primary) hypertension: Secondary | ICD-10-CM | POA: Diagnosis not present

## 2020-08-14 DIAGNOSIS — R053 Chronic cough: Secondary | ICD-10-CM

## 2020-08-14 DIAGNOSIS — Z9119 Patient's noncompliance with other medical treatment and regimen: Secondary | ICD-10-CM

## 2020-08-14 DIAGNOSIS — Z91199 Patient's noncompliance with other medical treatment and regimen due to unspecified reason: Secondary | ICD-10-CM

## 2020-08-14 DIAGNOSIS — F419 Anxiety disorder, unspecified: Secondary | ICD-10-CM

## 2020-08-14 DIAGNOSIS — E039 Hypothyroidism, unspecified: Secondary | ICD-10-CM | POA: Diagnosis not present

## 2020-08-14 DIAGNOSIS — Z720 Tobacco use: Secondary | ICD-10-CM

## 2020-08-14 DIAGNOSIS — E1149 Type 2 diabetes mellitus with other diabetic neurological complication: Secondary | ICD-10-CM

## 2020-08-14 DIAGNOSIS — E781 Pure hyperglyceridemia: Secondary | ICD-10-CM | POA: Diagnosis not present

## 2020-08-14 LAB — T4, FREE: Free T4: 0.9 ng/dL (ref 0.60–1.60)

## 2020-08-14 LAB — BASIC METABOLIC PANEL
BUN: 16 mg/dL (ref 6–23)
CO2: 28 mEq/L (ref 19–32)
Calcium: 9.2 mg/dL (ref 8.4–10.5)
Chloride: 95 mEq/L — ABNORMAL LOW (ref 96–112)
Creatinine, Ser: 0.8 mg/dL (ref 0.40–1.20)
GFR: 79.22 mL/min (ref >60.00)
Glucose, Bld: 341 mg/dL — ABNORMAL HIGH (ref 70–99)
Potassium: 4.6 mEq/L (ref 3.5–5.1)
Sodium: 130 mEq/L — ABNORMAL LOW (ref 135–145)

## 2020-08-14 LAB — CBC
HCT: 46.2 % — ABNORMAL HIGH (ref 36.0–46.0)
Hemoglobin: 15.9 g/dL — ABNORMAL HIGH (ref 12.0–15.0)
MCHC: 34.4 g/dL (ref 30.0–36.0)
MCV: 89.5 fl (ref 78.0–100.0)
Platelets: 262 10*3/uL (ref 150.0–400.0)
RBC: 5.17 Mil/uL — ABNORMAL HIGH (ref 3.87–5.11)
RDW: 13.2 % (ref 11.5–15.5)
WBC: 6.4 10*3/uL (ref 4.0–10.5)

## 2020-08-14 LAB — LIPID PANEL
Cholesterol: 387 mg/dL — ABNORMAL HIGH (ref 0–200)
HDL: 38.5 mg/dL — ABNORMAL LOW (ref >39.00)
Total CHOL/HDL Ratio: 10
Triglycerides: 1467 mg/dL — ABNORMAL HIGH (ref 0.0–149.0)

## 2020-08-14 LAB — MICROALBUMIN / CREATININE URINE RATIO
Creatinine,U: 36.8 mg/dL
Microalb Creat Ratio: 1.9 mg/g (ref 0.0–30.0)
Microalb, Ur: <0.7 mg/dL (ref 0.0–1.9)

## 2020-08-14 LAB — HEMOGLOBIN A1C: Hgb A1c MFr Bld: 14.2 % — ABNORMAL HIGH (ref 4.6–6.5)

## 2020-08-14 LAB — LDL CHOLESTEROL, DIRECT: Direct LDL: 111 mg/dL

## 2020-08-14 LAB — AST: AST: 16 U/L (ref 0–37)

## 2020-08-14 LAB — VITAMIN D 25 HYDROXY (VIT D DEFICIENCY, FRACTURES): VITD: 28.4 ng/mL — ABNORMAL LOW (ref 30.00–100.00)

## 2020-08-14 LAB — ALT: ALT: 24 U/L (ref 0–35)

## 2020-08-14 LAB — TSH: TSH: 2.89 u[IU]/mL (ref 0.35–4.50)

## 2020-08-14 MED ORDER — LOSARTAN POTASSIUM 25 MG PO TABS
25.0000 mg | ORAL_TABLET | Freq: Every day | ORAL | 1 refills | Status: DC
Start: 2020-08-14 — End: 2023-04-05

## 2020-08-14 MED ORDER — DULOXETINE HCL 30 MG PO CPEP: 30.0000 mg | ORAL_CAPSULE | Freq: Every day | ORAL | 3 refills | Status: DC

## 2020-08-14 MED ORDER — HYDROXYZINE HCL 25 MG PO TABS: 25.0000 mg | ORAL_TABLET | Freq: Every day | ORAL | 1 refills | Status: DC

## 2020-08-14 MED ORDER — METFORMIN HCL 1000 MG PO TABS: 1000.0000 mg | ORAL_TABLET | Freq: Two times a day (BID) | ORAL | 1 refills | Status: DC

## 2020-08-14 MED ORDER — GABAPENTIN 300 MG PO CAPS: 300.0000 mg | ORAL_CAPSULE | Freq: Three times a day (TID) | ORAL | 1 refills | Status: DC

## 2020-08-14 NOTE — Progress Notes (Signed)
Chief Complaint  Patient presents with  . Follow-up    F/u to discuss elevated BS 300- 400, nausea, chronic cough x months.  Declines flu shot today.    HPI: *Sara Banks is a 60 y.o. female here with concerns about elevated BS readings and symptoms of dizziness, nausea, tingling in feet and hands.  Pt has a h/o uncontrolled DM, with last A1C more than 1 year ago in 06/2019 of 10.5. I have not seen pt in more than 1 year. She takes metformin 500mg  BID. She ran out of trulicity 4-0JW ago.  Pt does check BS at home. Readings: 300-400  Pt also had HTN, HLD, hypothyroidism, Vit D deficiency. She is not taking levothyroxine 72mcg because she states the med made her hair fall out. She has not been taking for more than 1 year.  She is not taking lipitor 80mg  daily and states the tab is too large and she cannot swallow.  She is taking losartan 25mg  daily.   Pt complains of chronic cough x years. She feels it has been worse in the past year.  She is a smoker - 1/2ppd.   Lab Results  Component Value Date   HGBA1C 10.5 (H) 07/13/2019   Lab Results  Component Value Date   MICROALBUR 3.8 (H) 07/13/2019   Lab Results  Component Value Date   CREATININE 0.85 07/13/2019   Lab Results  Component Value Date   CHOL 111 07/13/2019   HDL 48.60 07/13/2019   LDLCALC 101 (H) 05/12/2013   LDLDIRECT 37.0 07/13/2019   TRIG 223.0 (H) 07/13/2019   CHOLHDL 2 07/13/2019   Lab Results  Component Value Date   NA 137 07/13/2019   K 4.8 07/13/2019   CREATININE 0.85 07/13/2019   GFRNONAA >60 04/08/2019   GFRAA >60 04/08/2019   GLUCOSE 298 (H) 07/13/2019   Lab Results  Component Value Date   WBC 8.3 04/08/2019   HGB 15.7 (H) 04/08/2019   HCT 47.5 (H) 04/08/2019   MCV 90.6 04/08/2019   PLT 241 04/08/2019   Last vitamin D Lab Results  Component Value Date   VD25OH 22.44 (L) 07/13/2019    Past Medical History:  Diagnosis Date  . Anxiety   . Arthritis   . Chronic pain   .  Depression   . Hepatitis C dx 2014   [facility]    Past Surgical History:  Procedure Laterality Date  . KNEE ARTHROSCOPY W/ ACL RECONSTRUCTION     right knee x 3  . TRANSTHORACIC ECHOCARDIOGRAM  2014   normal    Social History   Socioeconomic History  . Marital status: Single    Spouse name: Not on file  . Number of children: Not on file  . Years of education: Not on file  . Highest education level: Not on file  Occupational History  . Not on file  Tobacco Use  . Smoking status: Current Every Day Smoker    Packs/day: 0.50    Types: Cigarettes  . Smokeless tobacco: Never Used  Substance and Sexual Activity  . Alcohol use: No    Comment: rarely once a year wine  . Drug use: No  . Sexual activity: Not Currently  Other Topics Concern  . Not on file  Social History Narrative  . Not on file   Social Determinants of Health   Financial Resource Strain: Not on file  Food Insecurity: Not on file  Transportation Needs: Not on file  Physical  Activity: Not on file  Stress: Not on file  Social Connections: Not on file  Intimate Partner Violence: Not on file    Family History  Problem Relation Age of Onset  . Colon polyps Father   . Cancer - Other Mother      Immunization History  Administered Date(s) Administered  . Influenza-Unspecified 03/23/2020  . Moderna Sars-Covid-2 Vaccination 02/24/2020  . PFIZER(Purple Top)SARS-COV-2 Vaccination 02/24/2020, 03/23/2020  . Tdap 07/13/2019    Outpatient Encounter Medications as of 08/14/2020  Medication Sig  . Cholecalciferol 25 MCG (1000 UT) tablet Take by mouth.  . DULoxetine (CYMBALTA) 30 MG capsule Take 1 capsule (30 mg total) by mouth daily.  Marland Kitchen gabapentin (NEURONTIN) 300 MG capsule Take 300 mg by mouth 3 (three) times daily.  . hydrOXYzine (ATARAX/VISTARIL) 25 MG tablet TAKE 1 TABLET BY MOUTH EVERYDAY AT BEDTIME  . [DISCONTINUED] losartan (COZAAR) 25 MG tablet Take 1 tablet (25 mg total) by mouth daily.  .  [DISCONTINUED] metFORMIN (GLUCOPHAGE) 500 MG tablet Take 1 tablet (500 mg total) by mouth 2 (two) times daily.  Marland Kitchen atorvastatin (LIPITOR) 80 MG tablet Take by mouth. (Patient not taking: Reported on 08/14/2020)  . levothyroxine (SYNTHROID) 50 MCG tablet Take 1 tablet (50 mcg total) by mouth daily. (Patient not taking: Reported on 08/14/2020)  . losartan (COZAAR) 25 MG tablet Take 1 tablet (25 mg total) by mouth daily.  . metFORMIN (GLUCOPHAGE) 1000 MG tablet Take 1 tablet (1,000 mg total) by mouth 2 (two) times daily.  . TRULICITY 9.98 PJ/8.2NK SOPN INJECT 0.5 MLS INTO THE SKIN ONCE A WEEK. (Patient not taking: Reported on 08/14/2020)   No facility-administered encounter medications on file as of 08/14/2020.     ROS: Pertinent positives and negatives noted in HPI. Remainder of ROS non-contributory    No Known Allergies  BP 120/82   Pulse 80   Temp 97.6 F (36.4 C) (Temporal)   Ht 5\' 6"  (1.676 m)   Wt 202 lb 3.2 oz (91.7 kg)   SpO2 98%   BMI 32.64 kg/m   Wt Readings from Last 3 Encounters:  08/14/20 202 lb 3.2 oz (91.7 kg)  07/13/19 204 lb (92.5 kg)  04/08/19 210 lb (95.3 kg)   Temp Readings from Last 3 Encounters:  08/14/20 97.6 F (36.4 C) (Temporal)  07/13/19 (!) 96.8 F (36 C) (Temporal)  04/09/19 99.5 F (37.5 C) (Oral)   BP Readings from Last 3 Encounters:  08/14/20 120/82  07/13/19 110/78  04/09/19 122/60   Pulse Readings from Last 3 Encounters:  08/14/20 80  07/13/19 83  04/09/19 98   Physical Exam Constitutional:      General: She is not in acute distress.    Appearance: She is obese. She is not ill-appearing.  HENT:     Mouth/Throat:     Lips: Pink.     Mouth: Mucous membranes are moist.     Pharynx: No pharyngeal swelling or posterior oropharyngeal erythema.  Cardiovascular:     Rate and Rhythm: Normal rate and regular rhythm.     Pulses: Normal pulses.  Pulmonary:     Breath sounds: No stridor or decreased air movement. Decreased breath sounds present.  No wheezing or rhonchi.  Musculoskeletal:     Right lower leg: No edema.     Left lower leg: No edema.  Neurological:     Mental Status: She is alert and oriented to person, place, and time.  Psychiatric:        Behavior: Behavior normal.  A/P:  1. Uncontrolled type 2 diabetes mellitus with hyperglycemia (HCC) - uncontrolled and no f/u in 1+ year - FBS at home 300-400 Increase: - metFORMIN (GLUCOPHAGE) 1000 MG tablet; Take 1 tablet (1,000 mg total) by mouth 2 (two) times daily.  Dispense: 180 tablet; Refill: 1 - will need additional med(s) but will wait on A1C result to determine med/dose - Hemoglobin A1c - Microalbumin / creatinine urine ratio - gabapentin (NEURONTIN) 300 MG capsule; Take 1 capsule (300 mg total) by mouth 3 (three) times daily.  Dispense: 270 capsule; Refill: 1  2. Essential hypertension - controlled, at goal - Basic metabolic panel Refill: - losartan (COZAAR) 25 MG tablet; Take 1 tablet (25 mg total) by mouth daily.  Dispense: 90 tablet; Refill: 1 - CBC  3. Hypertriglyceridemia - pt not taking atorvastatin since tab is too large for her to swallow comfortably and feels if she cuts it the edges are too sharp - AST - ALT - Lipid panel  4. Hypothyroidism, unspecified type - pt has not taken levothyroxine in about 1 year - felt it was making her hair fall out - T4, free - TSH  5. Vitamin D deficiency - VITAMIN D 25 Hydroxy (Vit-D Deficiency, Fractures)  6. Type 2 diabetes mellitus with neurological complications (HCC) - uncontrolled and no f/u in 1+ year - FBS at home 300-400 Increase: - metFORMIN (GLUCOPHAGE) 1000 MG tablet; Take 1 tablet (1,000 mg total) by mouth 2 (two) times daily.  Dispense: 180 tablet; Refill: 1 - will need additional med(s) but will wait on A1C result to determine med/dose - Hemoglobin A1c - Microalbumin / creatinine urine ratio - gabapentin (NEURONTIN) 300 MG capsule; Take 1 capsule (300 mg total) by mouth 3 (three) times  daily.  Dispense: 270 capsule; Refill: 1  7. Tobacco abuse 8. Chronic cough - likely chronic bronchitis - CT CHEST LUNG CA SCREEN LOW DOSE W/O CM; Future  9. Anxiety - stable, at baseline Refill: - DULoxetine (CYMBALTA) 30 MG capsule; Take 1 capsule (30 mg total) by mouth daily.  Dispense: 90 capsule; Refill: 3 - hydrOXYzine (ATARAX/VISTARIL) 25 MG tablet; Take 1 tablet (25 mg total) by mouth at bedtime.  Dispense: 90 tablet; Refill: 1    This visit occurred during the SARS-CoV-2 public health emergency.  Safety protocols were in place, including screening questions prior to the visit, additional usage of staff PPE, and extensive cleaning of exam room while observing appropriate contact time as indicated for disinfecting solutions.

## 2020-08-22 ENCOUNTER — Encounter: Payer: Self-pay | Admitting: Family Medicine

## 2020-08-22 ENCOUNTER — Telehealth (INDEPENDENT_AMBULATORY_CARE_PROVIDER_SITE_OTHER): Payer: 59 | Admitting: Family Medicine

## 2020-08-22 VITALS — Ht 66.0 in

## 2020-08-22 DIAGNOSIS — E781 Pure hyperglyceridemia: Secondary | ICD-10-CM | POA: Diagnosis not present

## 2020-08-22 DIAGNOSIS — E1165 Type 2 diabetes mellitus with hyperglycemia: Secondary | ICD-10-CM

## 2020-08-22 MED ORDER — TRULICITY 3 MG/0.5ML ~~LOC~~ SOAJ: 3.0000 mg | SUBCUTANEOUS | 5 refills | Status: DC

## 2020-08-22 NOTE — Progress Notes (Signed)
Virtual Visit via Video Note  I connected with Sara Banks on 08/22/20 at  2:00 PM EST by a video enabled telemedicine application and verified that I am speaking with the correct person using two identifiers. Location patient: home Location provider: work Persons participating in the virtual visit: patient, provider  I discussed the limitations of evaluation and management by telemedicine and the availability of in person appointments. The patient expressed understanding and agreed to proceed.  Chief Complaint  Patient presents with  . Follow-up    Pt will like to discuss ongoing cough.  Discuss labs.     HPI: Sara Banks is a 60 y.o. female seen today to review recent lab results, specifically her A1C and lipid panel.  For HLD, she was on lipitor 80mg  daily but pt was not taking until this past week. For DM, pt is taking metformin 1000mg  BID, this was increased from 500mg  BID 1 week ago. She is Rx'd trulicity but is not taking x 2-3 mo since she ran out.    Lab Results  Component Value Date   HGBA1C 14.2 (H) 08/14/2020   Lab Results  Component Value Date   CHOL 387 (H) 08/14/2020   HDL 38.50 (L) 08/14/2020   LDLCALC 101 (H) 05/12/2013   LDLDIRECT 111.0 08/14/2020   TRIG (H) 08/14/2020    1467.0 Triglyceride is over 400; calculations on Lipids are invalid.   CHOLHDL 10 08/14/2020   Lab Results  Component Value Date   NA 130 (L) 08/14/2020   K 4.6 08/14/2020   CREATININE 0.80 08/14/2020   GFRNONAA >60 04/08/2019   GFRAA >60 04/08/2019   GLUCOSE 341 (H) 08/14/2020   Lab Results  Component Value Date   ALT 24 08/14/2020   AST 16 08/14/2020   ALKPHOS 68 04/08/2019   BILITOT 0.8 04/08/2019   Lab Results  Component Value Date   TSH 2.89 08/14/2020    Past Medical History:  Diagnosis Date  . Anxiety   . Arthritis   . Chronic pain   . Depression   . Hepatitis C dx 2014   [facility]    Past Surgical History:  Procedure Laterality Date   . KNEE ARTHROSCOPY W/ ACL RECONSTRUCTION     right knee x 3  . TRANSTHORACIC ECHOCARDIOGRAM  2014   normal    Family History  Problem Relation Age of Onset  . Colon polyps Father   . Cancer - Other Mother     Social History   Tobacco Use  . Smoking status: Current Every Day Smoker    Packs/day: 0.50    Types: Cigarettes  . Smokeless tobacco: Never Used  Substance Use Topics  . Alcohol use: No    Comment: rarely once a year wine  . Drug use: No     Current Outpatient Medications:  .  albuterol (VENTOLIN HFA) 108 (90 Base) MCG/ACT inhaler, 2 puff(s), Disp: , Rfl:  .  atorvastatin (LIPITOR) 80 MG tablet, Take by mouth., Disp: , Rfl:  .  Cholecalciferol 25 MCG (1000 UT) tablet, Take by mouth., Disp: , Rfl:  .  Dulaglutide (TRULICITY) 3 EN/4.0HW SOPN, Inject 3 mg as directed once a week., Disp: 2 mL, Rfl: 5 .  DULoxetine (CYMBALTA) 30 MG capsule, Take 1 capsule (30 mg total) by mouth daily., Disp: 90 capsule, Rfl: 3 .  furosemide (LASIX) 20 MG tablet, 1-2 tab(s), Disp: , Rfl:  .  gabapentin (NEURONTIN) 300 MG capsule, Take 1 capsule (300 mg total)  by mouth 3 (three) times daily., Disp: 270 capsule, Rfl: 1 .  hydrOXYzine (ATARAX/VISTARIL) 25 MG tablet, Take 1 tablet (25 mg total) by mouth at bedtime., Disp: 90 tablet, Rfl: 1 .  losartan (COZAAR) 25 MG tablet, Take 1 tablet (25 mg total) by mouth daily., Disp: 90 tablet, Rfl: 1 .  metFORMIN (GLUCOPHAGE) 1000 MG tablet, Take 1 tablet (1,000 mg total) by mouth 2 (two) times daily., Disp: 180 tablet, Rfl: 1 .  levothyroxine (SYNTHROID) 50 MCG tablet, Take 1 tablet (50 mcg total) by mouth daily. (Patient not taking: No sig reported), Disp: 90 tablet, Rfl: 3  No Known Allergies    ROS: See pertinent positives and negatives per HPI.   EXAM:  VITALS per patient if applicable: Ht 5\' 6"  (1.676 m)   BMI 32.64 kg/m    GENERAL: alert, oriented, appears well and in no acute distress  HEENT: atraumatic, conjunctiva clear, no  obvious abnormalities on inspection of external nose and ears  NECK: normal movements of the head and neck  LUNGS: on inspection no signs of respiratory distress, breathing rate appears normal, no obvious gross SOB, gasping or wheezing, no conversational dyspnea  CV: no obvious cyanosis  PSYCH/NEURO: no obvious depression or anxiety, speech and thought processing grossly intact   ASSESSMENT AND PLAN: 1. Uncontrolled type 2 diabetes mellitus with hyperglycemia (HCC) Lab Results  Component Value Date   HGBA1C 14.2 (H) 08/14/2020   - cont metformin 1000mg  BID Rx: - Dulaglutide (TRULICITY) 3 HD/6.2IW SOPN; Inject 3 mg as directed once a week.  Dispense: 2 mL; Refill: 5 - increased from 0.75mg  which pt has not been taking x 2-3 mo - check BS and keep log - cont statin, ARB - stressed importance of adhering to low carb, low sugar diet and starting a regular exercise regimen - f/u in 3 mo or sooner PRN  2. Hypertriglyceridemia - pt has been off lipitor 80mg  x months and just restarted in the past week - off med, TG and total cholesterol have increased significantly - recheck FLP in 3 mo and will plan to likely add fenofibrate or other at that time   I discussed the assessment and treatment plan with the patient. The patient was provided an opportunity to ask questions and all were answered. The patient agreed with the plan and demonstrated an understanding of the instructions.   The patient was advised to call back or seek an in-person evaluation if the symptoms worsen or if the condition fails to improve as anticipated.   Letta Median, DO

## 2020-08-29 ENCOUNTER — Ambulatory Visit
Admission: RE | Admit: 2020-08-29 | Discharge: 2020-08-29 | Disposition: A | Discharge status: Home / Self Care | Payer: 59 | Source: Ambulatory Visit | Attending: Family Medicine | Admitting: Family Medicine

## 2020-08-29 ENCOUNTER — Other Ambulatory Visit: Payer: Self-pay

## 2020-08-29 DIAGNOSIS — Z720 Tobacco use: Secondary | ICD-10-CM

## 2020-09-02 ENCOUNTER — Telehealth: Payer: Self-pay | Admitting: Family Medicine

## 2020-09-02 DIAGNOSIS — R195 Other fecal abnormalities: Secondary | ICD-10-CM

## 2020-09-02 NOTE — Telephone Encounter (Signed)
Pt would like a cb concerning her most recent CT results. Please advise at [phone].

## 2020-09-03 NOTE — Telephone Encounter (Signed)
I believe you left message for this patient yesterday afternoon to call back about results  Please call pt (she requested call vs mychart message) to let her know CT chest showed a few very small (largest 14mm) lung nodules, not concerning, so repeat in 1 year is recommended. It also showed fatty infiltration of the liver but this can be improved with weight loss and low fat diet.

## 2020-09-03 NOTE — Telephone Encounter (Signed)
Please see message and advise.  Thank you. ° °

## 2020-09-04 NOTE — Telephone Encounter (Signed)
Referral placed per pts request She is on metformin which can cause loose stools and has h/o anxiety which can also cause the same. GI will call her to schedule

## 2020-09-04 NOTE — Telephone Encounter (Signed)
Pt called back again and requested call back as soon as you can

## 2020-09-04 NOTE — Telephone Encounter (Signed)
I spoke with pt and informed her of message.  Pt said she would like a referral to GI, due to her Bm's being loose and sometime not coming out correctly.  Pt thinks that her bowels are not moving properly and she has a lot of bloating and gassy after eating.  Please advise.

## 2020-09-04 NOTE — Telephone Encounter (Signed)
Pt informed

## 2020-09-10 ENCOUNTER — Ambulatory Visit
Admission: EM | Admit: 2020-09-10 | Discharge: 2020-09-10 | Disposition: A | Discharge status: Home / Self Care | Payer: 59 | Attending: Emergency Medicine | Admitting: Emergency Medicine

## 2020-09-10 ENCOUNTER — Other Ambulatory Visit: Payer: Self-pay

## 2020-09-10 ENCOUNTER — Ambulatory Visit: Payer: Self-pay

## 2020-09-10 ENCOUNTER — Ambulatory Visit (INDEPENDENT_AMBULATORY_CARE_PROVIDER_SITE_OTHER): Payer: 59

## 2020-09-10 ENCOUNTER — Ambulatory Visit: Payer: 59

## 2020-09-10 DIAGNOSIS — S8392XA Sprain of unspecified site of left knee, initial encounter: Secondary | ICD-10-CM | POA: Diagnosis not present

## 2020-09-10 DIAGNOSIS — W19XXXA Unspecified fall, initial encounter: Secondary | ICD-10-CM | POA: Diagnosis not present

## 2020-09-10 DIAGNOSIS — M25562 Pain in left knee: Secondary | ICD-10-CM | POA: Diagnosis not present

## 2020-09-10 MED ORDER — IBUPROFEN 800 MG PO TABS: 800.0000 mg | ORAL_TABLET | Freq: Three times a day (TID) | ORAL | 0 refills | Status: DC | PRN

## 2020-09-10 NOTE — ED Triage Notes (Signed)
Pt states fell out of her work [employer] to the concrete on Monday. Pt c/o lt knee pain radiating up and down lt leg.

## 2020-09-10 NOTE — ED Provider Notes (Signed)
[facility]    CSN: [id number] Arrival date & time: 09/10/20  7616      History   Chief Complaint Chief Complaint  Patient presents with  . Knee Injury    HPI Sara Banks is a 60 y.o. female.   Sara Banks presents with complaints of  Left knee pain after a fall two days ago. She got out of her vehicle and leg gave out (?), with a fall onto the cement, landing onto left knee and left arm. Some pain immediately, but pain has worsened over the past 24 hours. Has felt warm. Pain with weight bearing and with extension and flexion. Has had surgery to the knee for acl repair in the past, and has had a torn meniscus to this knee as well. These injuries were years ago. No numbness or tingling. Hasn't taken any medications for pain.      ROS per HPI, negative if not otherwise mentioned.      Past Medical History:  Diagnosis Date  . Anxiety   . Arthritis   . Chronic pain   . Depression   . Hepatitis C dx 2014   [facility]    Patient Active Problem List   Diagnosis Date Noted  . Vitamin D deficiency 07/26/2019  . Acute ischemic stroke of right pons 05/11/2013  . Chronic hepatitis C ([place]) 05/11/2013  . Tobacco abuse 05/11/2013  . Obesity, unspecified 05/11/2013  . Chronic back pain 05/11/2013    Past Surgical History:  Procedure Laterality Date  . KNEE ARTHROSCOPY W/ ACL RECONSTRUCTION     right knee x 3  . TRANSTHORACIC ECHOCARDIOGRAM  2014   normal    OB History   No obstetric history on file.      Home Medications    Prior to Admission medications   Medication Sig Start Date End Date Taking? Authorizing Provider  ibuprofen (ADVIL) 800 MG tablet Take 1 tablet (800 mg total) by mouth every 8 (eight) hours as needed for mild pain or moderate pain. 09/10/20  Yes Augusto Gamble B, NP  albuterol (VENTOLIN HFA) 108 (90 Base) MCG/ACT inhaler 2 puff(s) 09/04/18   [provider]  atorvastatin (LIPITOR) 80 MG tablet Take by  mouth.    [provider]  Cholecalciferol 25 MCG (1000 UT) tablet Take by mouth.    [provider]  Dulaglutide (TRULICITY) 3 WV/3.7TG SOPN Inject 3 mg as directed once a week. 08/22/20   Cirigliano, Garvin Fila, DO  DULoxetine (CYMBALTA) 30 MG capsule Take 1 capsule (30 mg total) by mouth daily. 08/14/20   Cirigliano, Garvin Fila, DO  furosemide (LASIX) 20 MG tablet 1-2 tab(s) 03/22/19   [provider]  gabapentin (NEURONTIN) 300 MG capsule Take 1 capsule (300 mg total) by mouth 3 (three) times daily. 08/14/20   Cirigliano, Garvin Fila, DO  hydrOXYzine (ATARAX/VISTARIL) 25 MG tablet Take 1 tablet (25 mg total) by mouth at bedtime. 08/14/20   Cirigliano, Garvin Fila, DO  losartan (COZAAR) 25 MG tablet Take 1 tablet (25 mg total) by mouth daily. 08/14/20   Cirigliano, Garvin Fila, DO  metFORMIN (GLUCOPHAGE) 1000 MG tablet Take 1 tablet (1,000 mg total) by mouth 2 (two) times daily. 08/14/20   CiriglianoGarvin Fila, DO    Family History Family History  Problem Relation Age of Onset  . Colon polyps Father   . Cancer - Other Mother     Social History Social History   Tobacco Use  . Smoking  status: Current Every Day Smoker    Packs/day: 0.50    Types: Cigarettes  . Smokeless tobacco: Never Used  Substance Use Topics  . Alcohol use: No    Comment: rarely once a year wine  . Drug use: No     Allergies   Patient has no known allergies.   Review of Systems Review of Systems   Physical Exam Triage Vital Signs ED Triage Vitals [09/10/20 0947]  Enc Vitals Group     BP 131/74     Pulse Rate 70     Resp 18     Temp 98 F (36.7 C)     Temp Source Oral     SpO2 95 %     Weight      Height      Head Circumference      Peak Flow      Pain Score 10     Pain Loc      Pain Edu?      Excl. in [place]?    No data found.  Updated Vital Signs BP 131/74 (BP Location: Left Arm)   Pulse 70   Temp 98 F (36.7 C) (Oral)   Resp 18   SpO2 95%   Visual Acuity Right Eye Distance:   Left Eye  Distance:   Bilateral Distance:    Right Eye Near:   Left Eye Near:    Bilateral Near:     Physical Exam Constitutional:      General: She is not in acute distress.    Appearance: She is well-developed.  Cardiovascular:     Rate and Rhythm: Normal rate.  Pulmonary:     Effort: Pulmonary effort is normal.  Musculoskeletal:     Left knee: Bony tenderness present. No swelling, erythema, ecchymosis or lacerations. Decreased range of motion. Tenderness present over the medial joint line and lateral joint line.     Comments: No obvious laxity to left knee; point tenderness to left inferior aspect of knee, no posterior knee pain; pain with flexion greater than 90 deg, and pain with extension greater than 150deg; no redness or warmth  Skin:    General: Skin is warm and dry.  Neurological:     Mental Status: She is alert and oriented to person, place, and time.      UC Treatments / Results  Labs (all labs ordered are listed, but only abnormal results are displayed) Labs Reviewed - No data to display  EKG   Radiology DG Knee Complete 4 Views Left  Result Date: 09/10/2020 CLINICAL DATA:  Fall, left knee pain EXAM: LEFT KNEE - COMPLETE 4+ VIEW COMPARISON:  12/10/2014 FINDINGS: Tricompartmental spurring loss of articular space especially in the medial compartment. Bony density along the intercondylar notch, favor spurring over free osteochondral loose fragment given the similarity to prior exams. Trace knee effusion in the suprapatellar bursa. No appreciable fracture or acute bony findings. IMPRESSION: Osteoarthritis.  Trace knee effusion.  No acute bony findings. Electronically Signed   By: Van Clines M.D.   On: 09/10/2020 10:32    Procedures Procedures (including critical care time)  Medications Ordered in UC Medications - No data to display  Initial Impression / Assessment and Plan / UC Course  I have reviewed the triage vital signs and the nursing notes.  Pertinent  labs & imaging results that were available during my care of the patient were reviewed by me and considered in my medical decision making (see chart for details).  Xray without acute findings here today. Trace effusion on imaging, sprain likely. Brace provided here today, with ice, elevation and nsaids recommended. Orthopedics follow up recommended as may need further imaging. Patient verbalized understanding and agreeable to plan. Ambulatory out of clinic with limp noted.   Final Clinical Impressions(s) / UC Diagnoses   Final diagnoses:  Sprain of left knee, unspecified ligament, initial encounter     Discharge Instructions     Ice, elevation, use of ibuprofen as needed for pain.  Brace for support and stabilization.  Activity as tolerated.  Your xray demonstrates some arthritis but no fractures here today.  Follow up with orthopedics as needed for persistent issues.     ED Prescriptions    Medication Sig Dispense Auth. Provider   ibuprofen (ADVIL) 800 MG tablet Take 1 tablet (800 mg total) by mouth every 8 (eight) hours as needed for mild pain or moderate pain. 30 tablet Zigmund Gottron, NP     PDMP not reviewed this encounter.   Zigmund Gottron, NP 09/10/20 1102

## 2020-09-10 NOTE — Discharge Instructions (Signed)
Ice, elevation, use of ibuprofen as needed for pain.  Brace for support and stabilization.  Activity as tolerated.  Your xray demonstrates some arthritis but no fractures here today.  Follow up with orthopedics as needed for persistent issues.

## 2020-09-12 ENCOUNTER — Encounter: Payer: Self-pay | Admitting: Family Medicine

## 2020-09-12 DIAGNOSIS — E781 Pure hyperglyceridemia: Secondary | ICD-10-CM

## 2020-09-12 DIAGNOSIS — E1165 Type 2 diabetes mellitus with hyperglycemia: Secondary | ICD-10-CM

## 2020-09-12 MED ORDER — ATORVASTATIN CALCIUM 80 MG PO TABS: 80.0000 mg | ORAL_TABLET | Freq: Every day | ORAL | 3 refills | Status: DC

## 2020-09-23 DIAGNOSIS — M25562 Pain in left knee: Secondary | ICD-10-CM | POA: Insufficient documentation

## 2020-12-14 ENCOUNTER — Other Ambulatory Visit: Payer: Self-pay | Admitting: Family Medicine

## 2020-12-14 DIAGNOSIS — E1165 Type 2 diabetes mellitus with hyperglycemia: Secondary | ICD-10-CM

## 2020-12-14 DIAGNOSIS — E1149 Type 2 diabetes mellitus with other diabetic neurological complication: Secondary | ICD-10-CM

## 2020-12-17 NOTE — Telephone Encounter (Signed)
Pt aware and said she will call back to schedule 

## 2021-02-05 ENCOUNTER — Other Ambulatory Visit: Payer: Self-pay

## 2021-02-05 DIAGNOSIS — E1149 Type 2 diabetes mellitus with other diabetic neurological complication: Secondary | ICD-10-CM

## 2021-02-05 MED ORDER — METFORMIN HCL 1000 MG PO TABS: 1000.0000 mg | ORAL_TABLET | Freq: Two times a day (BID) | ORAL | 0 refills | Status: DC

## 2021-05-10 ENCOUNTER — Other Ambulatory Visit: Payer: Self-pay | Admitting: Family

## 2021-05-10 DIAGNOSIS — E1149 Type 2 diabetes mellitus with other diabetic neurological complication: Secondary | ICD-10-CM

## 2021-05-10 DIAGNOSIS — F419 Anxiety disorder, unspecified: Secondary | ICD-10-CM

## 2021-05-11 NOTE — Addendum Note (Signed)
Addended by: Konrad Saha on: 05/11/2021 01:58 PM   Modules accepted: Orders

## 2021-05-11 NOTE — Telephone Encounter (Signed)
Refill request for: Hydroxyzine HCL 25 mg LR 08/14/20, #90, 1 rf LOV 08/14/20 FOV  none scheduled.     Please review and advise.  Thanks. Dm/cma

## 2021-05-12 NOTE — Telephone Encounter (Signed)
Patient notified VIA phone she has been to see a provider at [facility] and they refused to fill it as well.  She will contact them again.  Dm/cma

## 2021-06-21 ENCOUNTER — Encounter: Payer: Self-pay | Admitting: Gastroenterology

## 2021-08-06 ENCOUNTER — Other Ambulatory Visit: Payer: Self-pay | Admitting: Family

## 2021-08-06 DIAGNOSIS — E1149 Type 2 diabetes mellitus with other diabetic neurological complication: Secondary | ICD-10-CM

## 2022-05-17 ENCOUNTER — Encounter (INDEPENDENT_AMBULATORY_CARE_PROVIDER_SITE_OTHER): Payer: Self-pay

## 2022-05-25 NOTE — Progress Notes (Unsigned)
New Patient Visit  There were no vitals taken for this visit.   Subjective:    Patient ID: Sara Banks, female    DOB: [birth year], 61 y.o.   MRN: [id number]  CC: No chief complaint on file.   HPI: Sara Banks is a 61 y.o. female presents for new patient visit to establish care.  Introduced to Designer, jewellery role and practice setting.  All questions answered.  Discussed provider/patient relationship and expectations.  She has a history of diabetes and is taking.   She has a history of hyperlipidemia and hypertension and is taking    She has a history of anxiety and depression and is taking.   She has a history of hepatitis C.   Past Medical History:  Diagnosis Date   Anxiety    Arthritis    Chronic pain    Depression    Hepatitis C dx 2014   [facility]    Past Surgical History:  Procedure Laterality Date   KNEE ARTHROSCOPY W/ ACL RECONSTRUCTION     right knee x 3   TRANSTHORACIC ECHOCARDIOGRAM  2014   normal    Family History  Problem Relation Age of Onset   Colon polyps Father    Cancer - Other Mother      Social History   Tobacco Use   Smoking status: Every Day    Packs/day: 0.50    Types: Cigarettes   Smokeless tobacco: Never  Substance Use Topics   Alcohol use: No    Comment: rarely once a year wine   Drug use: No    Current Outpatient Medications on File Prior to Visit  Medication Sig Dispense Refill   albuterol (VENTOLIN HFA) 108 (90 Base) MCG/ACT inhaler 2 puff(s)     atorvastatin (LIPITOR) 80 MG tablet Take 1 tablet (80 mg total) by mouth daily. 90 tablet 3   Cholecalciferol 25 MCG (1000 UT) tablet Take by mouth.     Dulaglutide (TRULICITY) 3 KG/8.1EH SOPN Inject 3 mg as directed once a week. 2 mL 5   DULoxetine (CYMBALTA) 30 MG capsule Take 1 capsule (30 mg total) by mouth daily. 90 capsule 3   furosemide (LASIX) 20 MG tablet 1-2 tab(s)     gabapentin (NEURONTIN) 300 MG capsule TAKE 1 CAPSULE BY MOUTH THREE TIMES A  DAY 90 capsule 0   hydrOXYzine (ATARAX/VISTARIL) 25 MG tablet Take 1 tablet (25 mg total) by mouth at bedtime. 90 tablet 1   ibuprofen (ADVIL) 800 MG tablet Take 1 tablet (800 mg total) by mouth every 8 (eight) hours as needed for mild pain or moderate pain. 30 tablet 0   losartan (COZAAR) 25 MG tablet Take 1 tablet (25 mg total) by mouth daily. 90 tablet 1   metFORMIN (GLUCOPHAGE) 1000 MG tablet Take 1 tablet (1,000 mg total) by mouth 2 (two) times daily. **Need to schedule an appointment before anymore refills given** 180 tablet 0   No current facility-administered medications on file prior to visit.     Review of Systems      Objective:    There were no vitals taken for this visit.  Wt Readings from Last 3 Encounters:  08/14/20 202 lb 3.2 oz (91.7 kg)  07/13/19 204 lb (92.5 kg)  04/08/19 210 lb (95.3 kg)    BP Readings from Last 3 Encounters:  09/10/20 131/74  08/14/20 120/82  07/13/19 110/78    Physical Exam     Assessment & Plan:  Problem List Items Addressed This Visit   None    Follow up plan: No follow-ups on file.

## 2022-05-26 ENCOUNTER — Encounter: Payer: Self-pay | Admitting: Nurse Practitioner

## 2022-05-26 ENCOUNTER — Ambulatory Visit (INDEPENDENT_AMBULATORY_CARE_PROVIDER_SITE_OTHER): Payer: 59 | Admitting: Nurse Practitioner

## 2022-05-26 VITALS — BP 124/80 | HR 88 | Temp 98.1°F | Ht 66.0 in | Wt 200.0 lb

## 2022-05-26 DIAGNOSIS — Z794 Long term (current) use of insulin: Secondary | ICD-10-CM

## 2022-05-26 DIAGNOSIS — E559 Vitamin D deficiency, unspecified: Secondary | ICD-10-CM | POA: Diagnosis not present

## 2022-05-26 DIAGNOSIS — M25511 Pain in right shoulder: Secondary | ICD-10-CM

## 2022-05-26 DIAGNOSIS — Z8673 Personal history of transient ischemic attack (TIA), and cerebral infarction without residual deficits: Secondary | ICD-10-CM | POA: Insufficient documentation

## 2022-05-26 DIAGNOSIS — M25562 Pain in left knee: Secondary | ICD-10-CM

## 2022-05-26 DIAGNOSIS — E1140 Type 2 diabetes mellitus with diabetic neuropathy, unspecified: Secondary | ICD-10-CM | POA: Diagnosis not present

## 2022-05-26 DIAGNOSIS — B182 Chronic viral hepatitis C: Secondary | ICD-10-CM

## 2022-05-26 DIAGNOSIS — E1165 Type 2 diabetes mellitus with hyperglycemia: Secondary | ICD-10-CM

## 2022-05-26 DIAGNOSIS — Z720 Tobacco use: Secondary | ICD-10-CM

## 2022-05-26 DIAGNOSIS — R1310 Dysphagia, unspecified: Secondary | ICD-10-CM | POA: Diagnosis not present

## 2022-05-26 DIAGNOSIS — R21 Rash and other nonspecific skin eruption: Secondary | ICD-10-CM

## 2022-05-26 DIAGNOSIS — R928 Other abnormal and inconclusive findings on diagnostic imaging of breast: Secondary | ICD-10-CM

## 2022-05-26 DIAGNOSIS — M25512 Pain in left shoulder: Secondary | ICD-10-CM

## 2022-05-26 DIAGNOSIS — I700 Atherosclerosis of aorta: Secondary | ICD-10-CM | POA: Insufficient documentation

## 2022-05-26 DIAGNOSIS — Z1231 Encounter for screening mammogram for malignant neoplasm of breast: Secondary | ICD-10-CM

## 2022-05-26 DIAGNOSIS — F419 Anxiety disorder, unspecified: Secondary | ICD-10-CM

## 2022-05-26 DIAGNOSIS — G8929 Other chronic pain: Secondary | ICD-10-CM

## 2022-05-26 LAB — COMPREHENSIVE METABOLIC PANEL
ALT: 49 U/L — ABNORMAL HIGH (ref 0–35)
AST: 24 U/L (ref 0–37)
Albumin: 4.6 g/dL (ref 3.5–5.2)
Alkaline Phosphatase: 120 U/L — ABNORMAL HIGH (ref 39–117)
BUN: 15 mg/dL (ref 6–23)
CO2: 27 mEq/L (ref 19–32)
Calcium: 9.9 mg/dL (ref 8.4–10.5)
Chloride: 100 mEq/L (ref 96–112)
Creatinine, Ser: 0.78 mg/dL (ref 0.40–1.20)
GFR: 80.65 mL/min (ref >60.00)
Glucose, Bld: 386 mg/dL — ABNORMAL HIGH (ref 70–99)
Potassium: 4.6 mEq/L (ref 3.5–5.1)
Sodium: 137 mEq/L (ref 135–145)
Total Bilirubin: 0.5 mg/dL (ref 0.2–1.2)
Total Protein: 6.9 g/dL (ref 6.0–8.3)

## 2022-05-26 LAB — CBC
HCT: 49.7 % — ABNORMAL HIGH (ref 36.0–46.0)
Hemoglobin: 17.3 g/dL — ABNORMAL HIGH (ref 12.0–15.0)
MCHC: 34.7 g/dL (ref 30.0–36.0)
MCV: 90.8 fl (ref 78.0–100.0)
Platelets: 274 10*3/uL (ref 150.0–400.0)
RBC: 5.47 Mil/uL — ABNORMAL HIGH (ref 3.87–5.11)
RDW: 13.8 % (ref 11.5–15.5)
WBC: 7.3 10*3/uL (ref 4.0–10.5)

## 2022-05-26 LAB — LIPID PANEL
Cholesterol: 133 mg/dL (ref 0–200)
HDL: 48.5 mg/dL (ref >39.00)
NonHDL: 84.72
Total CHOL/HDL Ratio: 3
Triglycerides: 398 mg/dL — ABNORMAL HIGH (ref 0.0–149.0)
VLDL: 79.6 mg/dL — ABNORMAL HIGH (ref 0.0–40.0)

## 2022-05-26 LAB — HEMOGLOBIN A1C: Hgb A1c MFr Bld: 14.7 % — ABNORMAL HIGH (ref 4.6–6.5)

## 2022-05-26 LAB — LDL CHOLESTEROL, DIRECT: Direct LDL: 42 mg/dL

## 2022-05-26 LAB — MICROALBUMIN / CREATININE URINE RATIO
Creatinine,U: 41.5 mg/dL
Microalb Creat Ratio: 3.4 mg/g (ref 0.0–30.0)
Microalb, Ur: 1.4 mg/dL (ref 0.0–1.9)

## 2022-05-26 LAB — VITAMIN D 25 HYDROXY (VIT D DEFICIENCY, FRACTURES): VITD: 45.06 ng/mL (ref 30.00–100.00)

## 2022-05-26 MED ORDER — HYDROXYZINE HCL 25 MG PO TABS: 25.0000 mg | ORAL_TABLET | Freq: Every day | ORAL | 1 refills | Status: DC

## 2022-05-26 MED ORDER — CLOTRIMAZOLE-BETAMETHASONE 1-0.05 % EX CREA: 1.0000 | TOPICAL_CREAM | Freq: Every day | CUTANEOUS | 0 refills | Status: DC

## 2022-05-26 MED ORDER — GABAPENTIN 300 MG PO CAPS: 300.0000 mg | ORAL_CAPSULE | Freq: Three times a day (TID) | ORAL | 1 refills | Status: DC

## 2022-05-26 MED ORDER — DULOXETINE HCL 30 MG PO CPEP: 30.0000 mg | ORAL_CAPSULE | Freq: Every day | ORAL | 3 refills | Status: DC

## 2022-05-26 NOTE — Assessment & Plan Note (Signed)
She has a history of hepatitis C and she was treated.  She states that she has been undetectable for the past 10 to 15 years.  She did have a ultrasound with elastography which showed possible early cirrhosis.  Will discuss this further next visit and possible rechecking an ultrasound.

## 2022-05-26 NOTE — Assessment & Plan Note (Signed)
She has a history of vitamin D deficiency and is taking vitamin D 5000 units daily.  Will check vitamin D levels and adjust regimen based on results.

## 2022-05-26 NOTE — Assessment & Plan Note (Signed)
She has been experiencing pain and feeling like something is moving inside her knee for the last few weeks.  She states her knee is weak and feels like it is going to give out.  She has a history of a fall requiring surgery to her left knee.  She is having some swelling.  Will have her take continue wearing the knee brace and place referral to orthopedics.

## 2022-05-26 NOTE — Patient Instructions (Addendum)
It was great to see you!  I have refilled your hydroxyzine and cymbalta.   Start clotrimazole cream once a day to the spot on your arm.   I recommend you get an eye exam once a year.   I have placed a referral to GI for your trouble swallowing and to orthopedics for your shoulders and knee. Keep wearing your knee brace.   I have ordered a mammogram, they will call to schedule.   Let's follow-up in 3 months, sooner if you have concerns.  If a referral was placed today, you will be contacted for an appointment. Please note that routine referrals can sometimes take up to 3-4 weeks to process. Please call our office if you haven't heard anything after this time frame.  Take care,  Vance Peper, NP

## 2022-05-26 NOTE — Assessment & Plan Note (Signed)
She has been experiencing dysphagia with solids and liquids.  She states that food will get stuck in her throat and she will gag and cough until it comes up.  Will place a referral to GI.

## 2022-05-26 NOTE — Assessment & Plan Note (Addendum)
Chronic, ongoing.  Most recent A1c was 14.2.  She is currently taking Trulicity 3 mg weekly, glipizide XL 10 mg daily, Levemir 8 units at bedtime.  She is also having neuropathy in her hands and feet.  Will have her continue gabapentin 300 mg 3 times a day.  Continue losartan 25 mg daily for kidney protection.  Will check A1c, CMP, CBC, lipid panel and adjust regimen based on results.  Discussed getting a yearly eye exam.  Follow-up in 3 months.

## 2022-05-26 NOTE — Assessment & Plan Note (Signed)
She currently smokes about half a pack a day.  She had a CT lung cancer screening done last year which was normal.  Will discuss this next visit.  Encourage complete tobacco cessation.

## 2022-05-26 NOTE — Assessment & Plan Note (Signed)
She has been having chronic pain in her shoulders that radiate down her arm.  She is taking ibuprofen as needed for this.  Will have her continue this and place a referral to orthopedics.

## 2022-05-26 NOTE — Assessment & Plan Note (Signed)
Noted on CT scan 08/29/20. Continue rosuvastatin '40mg'$  daily. Check lipid panel today.

## 2022-05-26 NOTE — Assessment & Plan Note (Signed)
Chronic, ongoing.  Will have her restart the duloxetine 30 mg daily and hydroxyzine 25 mg daily.  Follow-up in 3 months.

## 2022-05-26 NOTE — Assessment & Plan Note (Signed)
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  Will have her continue the rosuvastatin 40 mg daily.  She states that she was taking aspirin at one point, however even the low-dose caused her to have an upset stomach.

## 2022-05-27 ENCOUNTER — Encounter: Payer: Self-pay | Admitting: Nurse Practitioner

## 2022-05-27 DIAGNOSIS — E1140 Type 2 diabetes mellitus with diabetic neuropathy, unspecified: Secondary | ICD-10-CM

## 2022-06-08 ENCOUNTER — Ambulatory Visit (HOSPITAL_BASED_OUTPATIENT_CLINIC_OR_DEPARTMENT_OTHER)
Admission: RE | Admit: 2022-06-08 | Discharge: 2022-06-08 | Disposition: A | Discharge status: Home / Self Care | Payer: 59 | Source: Ambulatory Visit | Attending: Nurse Practitioner | Admitting: Nurse Practitioner

## 2022-06-08 DIAGNOSIS — Z1231 Encounter for screening mammogram for malignant neoplasm of breast: Secondary | ICD-10-CM | POA: Diagnosis not present

## 2022-06-10 NOTE — Addendum Note (Signed)
Addended by: Vance Peper A on: 06/10/2022 11:13 AM   Modules accepted: Orders

## 2022-06-11 ENCOUNTER — Other Ambulatory Visit: Payer: Self-pay | Admitting: Nurse Practitioner

## 2022-06-11 DIAGNOSIS — R928 Other abnormal and inconclusive findings on diagnostic imaging of breast: Secondary | ICD-10-CM

## 2022-06-21 ENCOUNTER — Other Ambulatory Visit: Payer: Self-pay

## 2022-06-21 ENCOUNTER — Ambulatory Visit
Admission: RE | Admit: 2022-06-21 | Discharge: 2022-06-21 | Disposition: A | Discharge status: Home / Self Care | Payer: 59 | Source: Ambulatory Visit | Attending: Nurse Practitioner | Admitting: Nurse Practitioner

## 2022-06-21 ENCOUNTER — Other Ambulatory Visit: Payer: Self-pay | Admitting: Nurse Practitioner

## 2022-06-21 DIAGNOSIS — Z794 Long term (current) use of insulin: Secondary | ICD-10-CM

## 2022-06-21 DIAGNOSIS — N6320 Unspecified lump in the left breast, unspecified quadrant: Secondary | ICD-10-CM

## 2022-06-21 DIAGNOSIS — R928 Other abnormal and inconclusive findings on diagnostic imaging of breast: Secondary | ICD-10-CM

## 2022-06-21 MED ORDER — INSULIN DETEMIR 100 UNIT/ML ~~LOC~~ SOLN: 8.0000 [IU] | Freq: Every day | SUBCUTANEOUS | 1 refills | Status: DC

## 2022-06-21 NOTE — Addendum Note (Signed)
Addended by: Vance Peper A on: 06/21/2022 04:54 PM   Modules accepted: Orders

## 2022-06-23 NOTE — Telephone Encounter (Signed)
Called pharmacy regarding requested prescription that was sent in on 06/21/22 per pharmacist patients insurance will only cover Tresiba or Lantis can Rx be changed to either?

## 2022-06-24 MED ORDER — TRESIBA FLEXTOUCH 100 UNIT/ML ~~LOC~~ SOPN: 8.0000 [IU] | PEN_INJECTOR | Freq: Every day | SUBCUTANEOUS | 0 refills | Status: DC

## 2022-06-25 MED ORDER — TRULICITY 3 MG/0.5ML ~~LOC~~ SOAJ: 3.0000 mg | SUBCUTANEOUS | 1 refills | Status: DC

## 2022-06-25 NOTE — Addendum Note (Signed)
Addended by: Vance Peper A on: 06/25/2022 04:04 PM   Modules accepted: Orders

## 2022-06-30 ENCOUNTER — Ambulatory Visit
Admission: RE | Admit: 2022-06-30 | Discharge: 2022-06-30 | Disposition: A | Discharge status: Home / Self Care | Payer: 59 | Source: Ambulatory Visit | Attending: Nurse Practitioner | Admitting: Nurse Practitioner

## 2022-06-30 ENCOUNTER — Other Ambulatory Visit: Payer: 59

## 2022-06-30 DIAGNOSIS — N6320 Unspecified lump in the left breast, unspecified quadrant: Secondary | ICD-10-CM

## 2022-06-30 HISTORY — PX: BREAST BIOPSY: SHX20

## 2022-08-18 ENCOUNTER — Encounter: Payer: Self-pay | Admitting: Physician Assistant

## 2022-09-24 ENCOUNTER — Ambulatory Visit: Payer: 59 | Admitting: Physician Assistant

## 2022-10-07 ENCOUNTER — Other Ambulatory Visit: Payer: Self-pay | Admitting: Nurse Practitioner

## 2022-10-07 DIAGNOSIS — F419 Anxiety disorder, unspecified: Secondary | ICD-10-CM

## 2022-10-28 ENCOUNTER — Other Ambulatory Visit: Payer: Self-pay | Admitting: Nurse Practitioner

## 2022-10-28 NOTE — Telephone Encounter (Signed)
Refill request for  Tresiba LR 06/24/22, 9 ml, 0 rf LOV 06/05/22 FOV  none scheduled  Please review and advise.  Thanks. Dm/cma

## 2022-11-18 ENCOUNTER — Ambulatory Visit (INDEPENDENT_AMBULATORY_CARE_PROVIDER_SITE_OTHER): Payer: 59 | Admitting: Nurse Practitioner

## 2022-11-18 ENCOUNTER — Encounter: Payer: Self-pay | Admitting: Nurse Practitioner

## 2022-11-18 DIAGNOSIS — K219 Gastro-esophageal reflux disease without esophagitis: Secondary | ICD-10-CM | POA: Diagnosis not present

## 2022-11-18 DIAGNOSIS — R140 Abdominal distension (gaseous): Secondary | ICD-10-CM | POA: Diagnosis not present

## 2022-11-18 DIAGNOSIS — K5909 Other constipation: Secondary | ICD-10-CM

## 2022-11-18 DIAGNOSIS — R1319 Other dysphagia: Secondary | ICD-10-CM | POA: Diagnosis not present

## 2022-11-18 DIAGNOSIS — Z1211 Encounter for screening for malignant neoplasm of colon: Secondary | ICD-10-CM

## 2022-11-18 MED ORDER — NA SULFATE-K SULFATE-MG SULF 17.5-3.13-1.6 GM/177ML PO SOLN: 1.0000 | ORAL | 0 refills | Status: DC

## 2022-11-18 MED ORDER — OMEPRAZOLE 40 MG PO CPDR: 40.0000 mg | DELAYED_RELEASE_CAPSULE | Freq: Every day | ORAL | 5 refills | Status: DC

## 2022-11-18 MED ORDER — LUBIPROSTONE 8 MCG PO CAPS: 8.0000 ug | ORAL_CAPSULE | Freq: Two times a day (BID) | ORAL | 5 refills | Status: DC

## 2022-11-18 NOTE — Progress Notes (Signed)
Agree with assessment and plan as outlined.  

## 2022-11-18 NOTE — Progress Notes (Signed)
Assessment / Plan   Primary GI: Wendall Papa, MD.  Sara Patrick, MD will resume her care.    GERD with reflux / dysphagia to solids and liquids.  -Discussed anti-reflux measures such as avoidance of late meals / bedtime snacks, HOB elevation (or use of wedge pillow), weight reduction ( if applicable)  / maintaining a healthy BMI ( body mass index),  and avoidance of trigger foods and caffeine.  -Not on treatment other than Tums / Rolaids. Trial of Omeprazole 40 mg 30 minutes before breakfast.  -Trulicity can cause delayed gastric emptying which can worsen reflux but she doesn't have typical gastroparesis symptoms -Schedule for EGD with possible esophageal dilation. The risks and benefits of EGD with possible biopsies were discussed with the patient who agrees to proceed.   Bloating / excessive flatulence. Symptoms generally relieved with defecation so probably due to constipation. Diet may also be contributing.  Also consider SIBO in setting of uncontrolled diabetes.  -Trial Gas x -Low gas diet given -Treat constipation -Follow up with me in 6 weeks  Chronic constipation characterized by infrequent BMs.   Previously tried Careers adviser. Grapes / grape juice and oatmeal help.  -Would rather she not rely on grape juice given uncontrolled diabetes. Will not recommend fiber supplement since she already struggles with bloating.  -Trial of Amitiza 8 mcg BID.  -Follow up with me in 6 weeks.   Colon cancer screening. Overdue for 10 year screening.  -Schedule for a colonoscopy. The risks and benefits of colonoscopy with possible polypectomy / biopsies were discussed and the patient agrees to proceed.  -2-day bowel prep  Uncontrolled type 2 diabetes.   Hemoglobin A1c in December 2023 was 14.7. On Trucility for several years.   History of Present Illness   Chief Complaint: Food and fluids getting stuck in esophagus, having acid reflux, bloating, excessive gas, constipation.   62  y.o. yo female with a past medical history consisting of, but not necessarily limited to DM2, hypertriglyceridemia, polycythemia. Patient is referred by her PCP for evaluation of dysphagia. She was last seen here in 2013  Sara Banks has multiple GI issues going on.   Reflux and Dysphagia Over the last year almost everything she eats and even fluids tend to hold up in her esophagus.  Sometimes food will not go down and she has to regurgitate it.  She does not have any heartburn but does reflux acidic fluid into her mouth, especially at night.  She takes Tums and Rolaids but nothing else for reflux. She goes to bed on empty stomach. She consumes minimal amounts of caffeine. She has lost a significant amount of weight over the last year due to dietary changes.   Bloating / gas Sara Banks complains of abdominal bloating which occurs immediately after eating. Also, she has excessive flatulence which she cannot relate to any certain foods. Symptoms resolve after a BM    Chronic constipation She has chronic constipation, sometimes goes days without a bowel movement. Took Miralax daily for a year without significant improvement. Exlax not helpful either.  Combination of grapes / grape juice and oatmeal helps    Previous Endoscopies / Labs /  Imaging   Jan 2013 colonoscopy  Several small scattered hyperplastic appearing rectosigmoid polyps.  These were sampled with biopsy.  Exam otherwise normal.  Path compatible with hyperplastic polyps.  A 10-year recall colonoscopy was recommended     Latest Ref Rng & Units 05/26/2022    9:14 AM 08/14/2020  10:02 AM 04/08/2019    8:43 PM  CBC  WBC 4.0 - 10.5 K/uL 7.3  6.4  8.3   Hemoglobin 12.0 - 15.0 g/dL 16.1  09.6  04.5   Hematocrit 36.0 - 46.0 % 49.7  46.2  47.5   Platelets 150.0 - 400.0 K/uL 274.0  262.0  241     Lab Results  Component Value Date   LIPASE 25 10/04/2015      Latest Ref Rng & Units 05/26/2022    9:14 AM 08/14/2020   10:02 AM 07/13/2019     9:03 AM  CMP  Glucose 70 - 99 mg/dL 409  811  914   BUN 6 - 23 mg/dL 15  16  15    Creatinine 0.40 - 1.20 mg/dL 7.82  9.56  2.13   Sodium 135 - 145 mEq/L 137  130  137   Potassium 3.5 - 5.1 mEq/L 4.6  4.6  4.8   Chloride 96 - 112 mEq/L 100  95  99   CO2 19 - 32 mEq/L 27  28  28    Calcium 8.4 - 10.5 mg/dL 9.9  9.2  9.6   Total Protein 6.0 - 8.3 g/dL 6.9     Total Bilirubin 0.2 - 1.2 mg/dL 0.5     Alkaline Phos 39 - 117 U/L 120     AST 0 - 37 U/L 24  16  27    ALT 0 - 35 U/L 49  24  54      Past Medical History:  Diagnosis Date   Acute ischemic stroke of right pons 05/11/2013   Anxiety    Arthritis    Chronic pain    Depression    Diabetes mellitus without complication (HCC)    GERD (gastroesophageal reflux disease)    Hepatitis C dx 2014   [facility]   Neuropathy    Past Surgical History:  Procedure Laterality Date   BREAST BIOPSY Left 06/30/2022   [place] LT BREAST BX W LOC DEV 1ST LESION IMG BX SPEC US GUIDE 06/30/2022 [facility] MAMMOGRAPHY   KNEE ARTHROSCOPY W/ ACL RECONSTRUCTION     right knee x 3   TRANSTHORACIC ECHOCARDIOGRAM  2014   normal   Family History  Problem Relation Age of Onset   Cancer Mother        brain   Cancer Father        unsure wich kind   Colon polyps Father    Social History   Tobacco Use   Smoking status: Every Day    Packs/day: 0.50    Years: 38.00    Additional pack years: 0.00    Total pack years: 19.00    Types: Cigarettes   Smokeless tobacco: Never  Vaping Use   Vaping Use: Never used  Substance Use Topics   Alcohol use: No    Comment: rarely once a year wine   Drug use: No   Current Outpatient Medications  Medication Sig Dispense Refill   albuterol (VENTOLIN HFA) 108 (90 Base) MCG/ACT inhaler 2 puff(s)     Cholecalciferol 25 MCG (1000 UT) tablet Take 5,000 Units by mouth.     clotrimazole-betamethasone (LOTRISONE) cream Apply 1 Application topically daily. 30 g 0   Dulaglutide (TRULICITY) 3 MG/0.5ML SOPN Inject 3 mg  as directed once a week. 6 mL 1   DULoxetine (CYMBALTA) 30 MG capsule Take 1 capsule (30 mg total) by mouth daily. 90 capsule 3   gabapentin (NEURONTIN) 300 MG capsule Take  1 capsule (300 mg total) by mouth 3 (three) times daily. 270 capsule 1   glipiZIDE (GLUCOTROL XL) 10 MG 24 hr tablet Take 10 mg by mouth daily.     hydrOXYzine (ATARAX) 25 MG tablet TAKE 1 TABLET BY MOUTH EVERYDAY AT BEDTIME 90 tablet 1   ibuprofen (ADVIL) 800 MG tablet Take 1 tablet (800 mg total) by mouth every 8 (eight) hours as needed for mild pain or moderate pain. 30 tablet 0   insulin degludec (TRESIBA FLEXTOUCH) 100 UNIT/ML FlexTouch Pen INJECT 8 UNITS INTO THE SKIN DAILY. 9 mL 0   losartan (COZAAR) 25 MG tablet Take 1 tablet (25 mg total) by mouth daily. 90 tablet 1   magnesium citrate SOLN Take 1 Bottle by mouth once.     rosuvastatin (CRESTOR) 40 MG tablet Take 40 mg by mouth daily.     No current facility-administered medications for this visit.   Allergies  Allergen Reactions   Aspirin Other (See Comments)    Stomach discomfort   Review of Systems: Positive for positive for back pain, vision changes, cough, fatigue, hearing problems, itching, muscle pain and cramps, night sweats, sleeping problems, swelling of feet and legs, excessive thirst, excessive urination, frequent urination, urine leakage.  All other systems reviewed and negative except where noted in HPI.   Wt Readings from Last 3 Encounters:  05/26/22 200 lb (90.7 kg)  08/14/20 202 lb 3.2 oz (91.7 kg)  07/13/19 204 lb (92.5 kg)    Physical Exam:  BP 114/78   Pulse 90   Ht 5\' 6"  (1.676 m)   Wt 197 lb 8 oz (89.6 kg)   BMI 31.88 kg/m  Constitutional:  Pleasant, generally well appearing female in no acute distress. Psychiatric:  Normal mood and affect. Behavior is normal. EENT: Pupils normal.  Conjunctivae are normal. No scleral icterus. Neck supple.  Cardiovascular: Normal rate, regular rhythm.  Pulmonary/chest: Effort normal and breath  sounds normal. No wheezing, rales or rhonchi. Abdominal: Soft, nondistended, nontender. Bowel sounds active throughout. There are no masses palpable. No hepatomegaly. Neurological: Alert and oriented to person place and time. Skin: Skin is warm and dry. No rashes noted.  Willette Cluster, NP  11/18/2022, 8:14 AM  Cc:  Referring Provider Gerre Scull, NP

## 2022-11-18 NOTE — Patient Instructions (Addendum)
_______________________________________________________  If your blood pressure at your visit was 140/90 or greater, please contact your primary care physician to follow up on this. _______________________________________________________ If you are age 64 or younger, your body mass index should be between 19-25. Your Body mass index is 31.88 kg/m. If this is out of the aformentioned range listed, please consider follow up with your Primary Care Provider.  ________________________________________________________  The [facility] providers would like to encourage you to use [facility] to communicate with providers for non-urgent requests or questions.  Due to long hold times on the telephone, sending your provider a message by [facility] may be a faster and more efficient way to get a response.  Please allow 48 business hours for a response.  Please remember that this is for non-urgent requests.  _______________________________________________________  Please purchase the following medications over the counter and take as directed:  START: GasX as needed.  We have sent the following medications to your pharmacy for you to pick up at your convenience:  START: Omeprazole 40mg  one capsule before breakfast each day. START: Amitiza one capsule two times daily.  You have been scheduled for an endoscopy and colonoscopy. Please follow the written instructions given to you at your visit today. Please pick up your prep supplies at the pharmacy within the next 1-3 days. If you use inhalers (even only as needed), please bring them with you on the day of your procedure.  Acid Reflux  Below are some measures you can take to possibly improve acid reflux symptoms . We may have discussed some of these today in the office. Not everything on this list may apply to you   --If you are taking anti-reflux ( GERD) medication be sure to take it 30 minutes before breakfast and if taking twice daily then also second  dose should be 30 minutes before dinner.   --Avoid late meals / bedtime snacks.   --Avoid trigger foods ( foods which you know tend to aggravate you reflux symptoms). Some common trigger foods include spicy foods, fatty foods, acidic foods, chocolate and caffeine.  --Elevate the head of bed 6-8 inches on blocks or bricks. If not able to elevate the head of the bed consider purchasing a wedge pillow to sleep on.    --Weight reduction / maintain a healthy BMI ( body mass index) may be help with reflux symptoms  --Sometimes with the above mentioned "lifestyle changes" patients are able to reduce the amount of GERD medications they take. Our goal is to have you on the lowest effective dose of medication  Due to recent changes in healthcare laws, you may see the results of your imaging and laboratory studies on MyChart before your provider has had a chance to review them.  We understand that in some cases there may be results that are confusing or concerning to you. Not all laboratory results come back in the same time frame and the provider may be waiting for multiple results in order to interpret others.  Please give [place] 48 hours in order for your provider to thoroughly review all the results before contacting the office for clarification of your results.   You are scheduled to follow up in our office on 02-07-23 at 9:30 am.  Thank you for entrusting me with your care and choosing [facility].  Willette Cluster, NP

## 2022-11-26 ENCOUNTER — Encounter: Payer: Self-pay | Admitting: Gastroenterology

## 2022-12-07 ENCOUNTER — Encounter: Payer: 59 | Admitting: Gastroenterology

## 2022-12-13 ENCOUNTER — Ambulatory Visit: Payer: 59 | Admitting: Family Medicine

## 2022-12-13 ENCOUNTER — Ambulatory Visit: Payer: 59 | Admitting: Gastroenterology

## 2022-12-13 ENCOUNTER — Encounter (HOSPITAL_COMMUNITY): Payer: Self-pay | Admitting: Emergency Medicine

## 2022-12-13 ENCOUNTER — Emergency Department (HOSPITAL_COMMUNITY)
Admission: EM | Admit: 2022-12-13 | Discharge: 2022-12-13 | Disposition: A | Discharge status: Home / Self Care | Payer: 59 | Attending: Emergency Medicine | Admitting: Emergency Medicine

## 2022-12-13 ENCOUNTER — Other Ambulatory Visit: Payer: Self-pay

## 2022-12-13 DIAGNOSIS — E119 Type 2 diabetes mellitus without complications: Secondary | ICD-10-CM

## 2022-12-13 DIAGNOSIS — Z7984 Long term (current) use of oral hypoglycemic drugs: Secondary | ICD-10-CM | POA: Diagnosis not present

## 2022-12-13 DIAGNOSIS — E1165 Type 2 diabetes mellitus with hyperglycemia: Secondary | ICD-10-CM | POA: Insufficient documentation

## 2022-12-13 DIAGNOSIS — R3589 Other polyuria: Secondary | ICD-10-CM | POA: Diagnosis present

## 2022-12-13 DIAGNOSIS — R000 Tachycardia, unspecified: Secondary | ICD-10-CM | POA: Insufficient documentation

## 2022-12-13 DIAGNOSIS — R739 Hyperglycemia, unspecified: Secondary | ICD-10-CM

## 2022-12-13 DIAGNOSIS — Z794 Long term (current) use of insulin: Secondary | ICD-10-CM | POA: Insufficient documentation

## 2022-12-13 LAB — URINALYSIS, ROUTINE W REFLEX MICROSCOPIC
Bilirubin Urine: NEGATIVE
Glucose, UA: >=500 mg/dL — AB
Hgb urine dipstick: NEGATIVE
Ketones, ur: NEGATIVE mg/dL
Leukocytes,Ua: NEGATIVE
Nitrite: NEGATIVE
Protein, ur: NEGATIVE mg/dL
Specific Gravity, Urine: 1.029 (ref 1.005–1.030)
pH: 5 (ref 5.0–8.0)

## 2022-12-13 LAB — CBC
HCT: 48.8 % — ABNORMAL HIGH (ref 36.0–46.0)
Hemoglobin: 16.9 g/dL — ABNORMAL HIGH (ref 12.0–15.0)
MCH: 30.2 pg (ref 26.0–34.0)
MCHC: 34.6 g/dL (ref 30.0–36.0)
MCV: 87.1 fL (ref 80.0–100.0)
Platelets: 266 10*3/uL (ref 150–400)
RBC: 5.6 MIL/uL — ABNORMAL HIGH (ref 3.87–5.11)
RDW: 12.5 % (ref 11.5–15.5)
WBC: 6.9 10*3/uL (ref 4.0–10.5)
nRBC: 0 % (ref 0.0–0.2)

## 2022-12-13 LAB — BASIC METABOLIC PANEL
Anion gap: 12 (ref 5–15)
BUN: 9 mg/dL (ref 8–23)
CO2: 21 mmol/L — ABNORMAL LOW (ref 22–32)
Calcium: 9.2 mg/dL (ref 8.9–10.3)
Chloride: 100 mmol/L (ref 98–111)
Creatinine, Ser: 0.94 mg/dL (ref 0.44–1.00)
GFR, Estimated: >60 mL/min (ref >60)
Glucose, Bld: 531 mg/dL (ref 70–99)
Potassium: 3.6 mmol/L (ref 3.5–5.1)
Sodium: 133 mmol/L — ABNORMAL LOW (ref 135–145)

## 2022-12-13 LAB — CBG MONITORING, ED
Glucose-Capillary: 536 mg/dL (ref 70–99)
Glucose-Capillary: 398 mg/dL — ABNORMAL HIGH (ref 70–99)
Glucose-Capillary: 307 mg/dL — ABNORMAL HIGH (ref 70–99)

## 2022-12-13 LAB — BETA-HYDROXYBUTYRIC ACID: Beta-Hydroxybutyric Acid: 0.54 mmol/L — ABNORMAL HIGH (ref 0.05–0.27)

## 2022-12-13 MED ORDER — INSULIN ASPART 100 UNIT/ML IJ SOLN
12.0000 [IU] | Freq: Once | INTRAMUSCULAR | Status: AC
  Administered 2022-12-13: 12 [IU] via SUBCUTANEOUS
  Filled 2022-12-13: qty 0.12

## 2022-12-13 MED ORDER — SODIUM CHLORIDE 0.9 % IV BOLUS
1000.0000 mL | Freq: Once | INTRAVENOUS | Status: AC
  Administered 2022-12-13: 1000 mL via INTRAVENOUS

## 2022-12-13 NOTE — ED Notes (Signed)
CRITICAL GLUCOSE  531 PROVIDER NOTIFIED

## 2022-12-13 NOTE — ED Triage Notes (Signed)
Pt sent from endo due to CBG being too high for procedure. Pt has not taken diabetic meds for 2 days due to preparing for procedure. Denies anything solid for 2 days. Endorses chronic back pain. Increased urinary frequency with clear liquid diet. Glucometer has been dead for 3 weeks, last check at home was 270.

## 2022-12-13 NOTE — ED Provider Notes (Signed)
[place] EMERGENCY DEPARTMENT AT [facility] Provider Note   CSN: [id number] Arrival date & time: 12/13/22  1422     History  Chief Complaint  Patient presents with   Hyperglycemia    Sara Banks is a 62 y.o. female.  Patient with hx dm, c/o being told at endoscopy today that they could not do her procedure b/c her blood sugar was too high. +polyuria and polydipsia. Indicates in prep for her procedure she had not taken any of her diabetes meds for past couple days. No fever/chills. No nausea/vomiting. No abd pain. No dysuria.   The history is provided by the patient, medical records and a relative.  Hyperglycemia Associated symptoms: increased thirst and polyuria   Associated symptoms: no abdominal pain, no chest pain, no confusion, no dysuria, no fever, no shortness of breath and no vomiting        Home Medications Prior to Admission medications   Medication Sig Start Date End Date Taking? Authorizing Provider  Cholecalciferol 25 MCG (1000 UT) tablet Take 5,000 Units by mouth.    [provider]  Dulaglutide (TRULICITY) 3 MG/0.5ML SOPN Inject 3 mg as directed once a week. Patient taking differently: Inject 3 mg as directed once a week. Saturday 06/25/22   McElwee, Jake Church, NP  DULoxetine (CYMBALTA) 30 MG capsule Take 1 capsule (30 mg total) by mouth daily. 05/26/22   McElwee, Lauren A, NP  gabapentin (NEURONTIN) 300 MG capsule Take 1 capsule (300 mg total) by mouth 3 (three) times daily. 05/26/22   McElwee, Lauren A, NP  glipiZIDE (GLUCOTROL XL) 10 MG 24 hr tablet Take 10 mg by mouth daily. 03/01/22   [provider]  hydrOXYzine (ATARAX) 25 MG tablet TAKE 1 TABLET BY MOUTH EVERYDAY AT BEDTIME 10/08/22   McElwee, Lauren A, NP  ibuprofen (ADVIL) 800 MG tablet Take 1 tablet (800 mg total) by mouth every 8 (eight) hours as needed for mild pain or moderate pain. Patient not taking: Reported on 11/18/2022 09/10/20   Linus Mako B, NP  insulin  degludec (TRESIBA FLEXTOUCH) 100 UNIT/ML FlexTouch Pen INJECT 8 UNITS INTO THE SKIN DAILY. 10/28/22   McElwee, Lauren A, NP  losartan (COZAAR) 25 MG tablet Take 1 tablet (25 mg total) by mouth daily. 08/14/20   Cirigliano, Jearld Lesch, DO  lubiprostone (AMITIZA) 8 MCG capsule Take 1 capsule (8 mcg total) by mouth 2 (two) times daily with a meal. 11/18/22   Meredith Pel, NP  magnesium citrate SOLN Take 1 Bottle by mouth once.    [provider]  Na Sulfate-K Sulfate-Mg Sulf (SUPREP BOWEL PREP KIT) 17.5-3.13-1.6 GM/177ML SOLN Take 1 kit by mouth as directed. 11/18/22   Meredith Pel, NP  omeprazole (PRILOSEC) 40 MG capsule Take 1 capsule (40 mg total) by mouth daily. 11/18/22   Meredith Pel, NP  rosuvastatin (CRESTOR) 40 MG tablet Take 40 mg by mouth daily.    [provider]      Allergies    Aspirin    Review of Systems   Review of Systems  Constitutional:  Negative for chills and fever.  HENT:  Negative for sore throat.   Eyes:  Negative for redness.  Respiratory:  Negative for cough and shortness of breath.   Cardiovascular:  Negative for chest pain.  Gastrointestinal:  Negative for abdominal pain, diarrhea and vomiting.  Endocrine: Positive for polydipsia and polyuria.  Genitourinary:  Negative for dysuria and flank pain.  Musculoskeletal:  Negative for back  pain and neck pain.  Skin:  Negative for rash.  Neurological:  Negative for headaches.  Hematological:  Does not bruise/bleed easily.  Psychiatric/Behavioral:  Negative for confusion.     Physical Exam Updated Vital Signs BP 135/89 (BP Location: Left Arm)   Pulse (!) 111   Temp (!) 97.4 F (36.3 C) (Oral)   Resp 18   Ht 1.676 m (5\' 6" )   Wt 89.8 kg   SpO2 97%   BMI 31.96 kg/m  Physical Exam Vitals and nursing note reviewed.  Constitutional:      Appearance: Normal appearance. She is well-developed.  HENT:     Head: Atraumatic.     Nose: Nose normal.     Mouth/Throat:     Mouth: Mucous  membranes are moist.     Pharynx: Oropharynx is clear.  Eyes:     General: No scleral icterus.    Conjunctiva/sclera: Conjunctivae normal.  Neck:     Trachea: No tracheal deviation.  Cardiovascular:     Rate and Rhythm: Regular rhythm. Tachycardia present.     Pulses: Normal pulses.     Heart sounds: Normal heart sounds. No murmur heard.    No friction rub. No gallop.  Pulmonary:     Effort: Pulmonary effort is normal. No respiratory distress.     Breath sounds: Normal breath sounds.  Abdominal:     General: Bowel sounds are normal. There is no distension.     Palpations: Abdomen is soft.     Tenderness: There is no abdominal tenderness. There is no guarding.  Genitourinary:    Comments: No cva tenderness.  Musculoskeletal:        General: No swelling or tenderness.     Cervical back: Normal range of motion and neck supple. No rigidity. No muscular tenderness.     Right lower leg: No edema.     Left lower leg: No edema.  Skin:    General: Skin is warm and dry.     Findings: No rash.  Neurological:     Mental Status: She is alert.     Comments: Alert, speech normal.   Psychiatric:        Mood and Affect: Mood normal.     ED Results / Procedures / Treatments   Labs (all labs ordered are listed, but only abnormal results are displayed) Results for orders placed or performed during the hospital encounter of 12/13/22  Basic metabolic panel  Result Value Ref Range   Sodium 133 (L) 135 - 145 mmol/L   Potassium 3.6 3.5 - 5.1 mmol/L   Chloride 100 98 - 111 mmol/L   CO2 21 (L) 22 - 32 mmol/L   Glucose, Bld 531 (HH) 70 - 99 mg/dL   BUN 9 8 - 23 mg/dL   Creatinine, Ser 0.86 0.44 - 1.00 mg/dL   Calcium 9.2 8.9 - 57.8 mg/dL   GFR, Estimated >46 >96 mL/min   Anion gap 12 5 - 15  CBC  Result Value Ref Range   WBC 6.9 4.0 - 10.5 K/uL   RBC 5.60 (H) 3.87 - 5.11 MIL/uL   Hemoglobin 16.9 (H) 12.0 - 15.0 g/dL   HCT 29.5 (H) 28.4 - 13.2 %   MCV 87.1 80.0 - 100.0 fL   MCH 30.2  26.0 - 34.0 pg   MCHC 34.6 30.0 - 36.0 g/dL   RDW 44.0 10.2 - 72.5 %   Platelets 266 150 - 400 K/uL   nRBC 0.0 0.0 - 0.2 %  Urinalysis, Routine w reflex microscopic -Urine, Clean Catch  Result Value Ref Range   Color, Urine STRAW (A) YELLOW   APPearance CLEAR CLEAR   Specific Gravity, Urine 1.029 1.005 - 1.030   pH 5.0 5.0 - 8.0   Glucose, UA >=500 (A) NEGATIVE mg/dL   Hgb urine dipstick NEGATIVE NEGATIVE   Bilirubin Urine NEGATIVE NEGATIVE   Ketones, ur NEGATIVE NEGATIVE mg/dL   Protein, ur NEGATIVE NEGATIVE mg/dL   Nitrite NEGATIVE NEGATIVE   Leukocytes,Ua NEGATIVE NEGATIVE   RBC / HPF 0-5 0 - 5 RBC/hpf   WBC, UA 0-5 0 - 5 WBC/hpf   Bacteria, UA RARE (A) NONE SEEN   Squamous Epithelial / HPF 0-5 0 - 5 /HPF   Mucus PRESENT   Beta-hydroxybutyric acid  Result Value Ref Range   Beta-Hydroxybutyric Acid 0.54 (H) 0.05 - 0.27 mmol/L  CBG monitoring, ED  Result Value Ref Range   Glucose-Capillary 536 (HH) 70 - 99 mg/dL   Comment 1 Notify RN      EKG None  Radiology No results found.  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 Labs: ordered. Decision-making details documented  in ED Course.  Risk Prescription drug management. Decision regarding hospitalization.   Iv ns. Continuous pulse ox and cardiac monitoring. Labs ordered/sent.   Differential diagnosis includes hyperglycemia, dka, dehydration, etc. Dispo decision including potential need for admission considered - will get labs and reassess.   Reviewed nursing notes and prior charts for additional history. External reports reviewed. Additional history from: family.   Cardiac monitor: sinus rhythm, rate 110.   Recent decrease po intake due to prep for endoscopy - also given high glucose, suspect some dehdyration. NS bolus.   Labs reviewed/interpreted by me - glucose high. Hco3 is 21 and AG not high. Novolog SQ, additional ivf.   Suspect after additional ivf, insulin, etc, pt will be stable for d/c. Pt indicates has adequate of her meds at home.   Signed out to Dr Lynelle Doctor to check repeat glucose, and if/when improving, anticipate probable d/c to home then.   Pt is tolerating po. No pain or nv.            Final Clinical Impression(s) / ED Diagnoses Final diagnoses:  Hyperglycemia  Insulin dependent type 2 diabetes mellitus [facility])    Rx / DC Orders ED Discharge Orders     None         Cathren Laine, MD 12/13/22 1631

## 2022-12-13 NOTE — ED Notes (Signed)
Pt also reports blurred vision for 2 weeks.

## 2022-12-13 NOTE — ED Provider Notes (Signed)
Patient was initially seen by Dr. Francee Piccolo.  Please see his notes.  Patient was noted to be hyperglycemic.  She has been treated with fluids and subcu insulin.  Plan was to recheck.  Patient states she is feeling better.  Her sugar has come down to 307 most recently.  Evaluation and diagnostic testing in the emergency department does not suggest an emergent condition requiring admission or immediate intervention beyond what has been performed at this time.  The patient is safe for discharge and has been instructed to return immediately for worsening symptoms, change in symptoms or any other concerns.    Linwood Dibbles, MD 12/13/22 [phone]

## 2022-12-13 NOTE — Discharge Instructions (Signed)
It was our pleasure to provide your ER care today - we hope that you feel better.  Drink plenty of water/fluids - stay well hydrated. Follow diabetes eating plan, continue diabetes meds, ahd monitor sugars before meals and at night, and keep record of your blood sugars.   Follow up with primary care doctor in the coming week - bring copy of your blood sugars with you.   Return to ER if worse, new symptoms, fevers, new/severe pain, persistent vomiting, chest pain, trouble breathing, or other concern.

## 2022-12-13 NOTE — Progress Notes (Unsigned)
Patient checked in for her procedures today - glucose not able to be read, > 600 x 2. Another repeat again at high 500s. She feels well. No mental status changes. No abdominal pain. A1c is in the 14s, she reports her glucose runs high at baseline.   Unfortunately her glucose levels are poorly controlled and not safe to give her anesthesia today at the current level. Recommend full labs right now given how high her glucose is. Best option for her is to go to the ED for lab draw and management of her glucose, assess for DKA or HHS. She is agreeable to go to the ED. She needs close follow up with PCP and better management of her DM prior to proceeding with her procedures again. She will contact us in a few weeks with an update and will schedule once her DM has been stablized and improved. She agrees, all questions answered.

## 2022-12-13 NOTE — Progress Notes (Unsigned)
Pt arrived at facility for procedure. During admission vitals it was noted that pt's CBG reading was >600 for two readings. RN checked a third time and it was 587. MD discussed risks of sedation with such an elevated glucose. Instructed pt to go to an ED for evaluation and possible treatment. Pt stated she was going to go to [facility] ED for evaluation.

## 2022-12-14 ENCOUNTER — Encounter: Payer: Self-pay | Admitting: Nurse Practitioner

## 2022-12-14 LAB — HEMOGLOBIN A1C
Hgb A1c MFr Bld: >15.5 % — ABNORMAL HIGH (ref 4.8–5.6)
Mean Plasma Glucose: >398 mg/dL

## 2022-12-15 ENCOUNTER — Ambulatory Visit (INDEPENDENT_AMBULATORY_CARE_PROVIDER_SITE_OTHER): Payer: 59 | Admitting: Nurse Practitioner

## 2022-12-15 ENCOUNTER — Encounter: Payer: Self-pay | Admitting: Nurse Practitioner

## 2022-12-15 VITALS — BP 120/80 | HR 80 | Resp 16 | Ht 65.0 in | Wt 197.0 lb

## 2022-12-15 DIAGNOSIS — Z7984 Long term (current) use of oral hypoglycemic drugs: Secondary | ICD-10-CM | POA: Diagnosis not present

## 2022-12-15 DIAGNOSIS — E1140 Type 2 diabetes mellitus with diabetic neuropathy, unspecified: Secondary | ICD-10-CM | POA: Diagnosis not present

## 2022-12-15 DIAGNOSIS — Z794 Long term (current) use of insulin: Secondary | ICD-10-CM | POA: Diagnosis not present

## 2022-12-15 DIAGNOSIS — Z7985 Long-term (current) use of injectable non-insulin antidiabetic drugs: Secondary | ICD-10-CM | POA: Diagnosis not present

## 2022-12-15 LAB — POCT GLUCOSE (DEVICE FOR HOME USE): POC Glucose: 391 mg/dl — AB (ref 70–99)

## 2022-12-15 MED ORDER — OZEMPIC (0.25 OR 0.5 MG/DOSE) 2 MG/1.5ML ~~LOC~~ SOPN: 0.2500 mg | PEN_INJECTOR | SUBCUTANEOUS | 1 refills | Status: DC

## 2022-12-15 MED ORDER — DEXCOM G7 SENSOR MISC: 1 refills | Status: DC

## 2022-12-15 MED ORDER — DEXCOM G7 RECEIVER DEVI: 1.0000 | 0 refills | Status: DC

## 2022-12-15 MED ORDER — FREESTYLE LIBRE 3 SENSOR MISC: 5 refills | Status: DC

## 2022-12-15 MED ORDER — DEXCOM G7 SENSOR MISC: 3 refills | Status: DC

## 2022-12-15 MED ORDER — TRESIBA FLEXTOUCH 100 UNIT/ML ~~LOC~~ SOPN: 15.0000 [IU] | PEN_INJECTOR | Freq: Every day | SUBCUTANEOUS | 0 refills | Status: DC

## 2022-12-15 NOTE — Patient Instructions (Signed)
It was great to see you!  I have ordered a continuous glucose monitor to wear. You change the sensor every 14 days.   Increase your tresiba to 15 units daily.   Stop the trulicty and switch to ozempic. This is a once a week injection. Start 0.25mg  once a week for 4 weeks then increase to 0.5mg  weekly.   Keep taking the glipizide.   Let's follow-up in 2 weeks, sooner if you have concerns.  If a referral was placed today, you will be contacted for an appointment. Please note that routine referrals can sometimes take up to 3-4 weeks to process. Please call our office if you haven't heard anything after this time frame.  Take care,  Rodman Pickle, NP

## 2022-12-15 NOTE — Progress Notes (Unsigned)
   Established Patient Office Visit  Subjective   Patient ID: Sara Banks, female    DOB: Oct 24, 1960  Age: 62 y.o. MRN: [id number]  Chief Complaint  Patient presents with   Hyperglycemia    HPI  {History (Optional):23778}  ROS    Objective:     Wt 197 lb (89.4 kg)   BMI 31.80 kg/m  {Vitals History (Optional):23777}  Physical Exam   No results found for any visits on 12/15/22.  {Labs (Optional):23779}  The ASCVD Risk score (Arnett DK, et al., 2019) failed to calculate for the following reasons:   The patient has a prior MI or stroke diagnosis    Assessment & Plan:   Problem List Items Addressed This Visit   None   No follow-ups on file.    Gerre Scull, NP

## 2022-12-16 DIAGNOSIS — Z7984 Long term (current) use of oral hypoglycemic drugs: Secondary | ICD-10-CM | POA: Insufficient documentation

## 2022-12-16 DIAGNOSIS — Z7985 Long-term (current) use of injectable non-insulin antidiabetic drugs: Secondary | ICD-10-CM | POA: Insufficient documentation

## 2022-12-16 NOTE — Assessment & Plan Note (Signed)
Chronic, not controlled.  Her glucose in the office today was 391 and this was 2 hours after eating oatmeal.  Her A1c is greater than 15.5.  She has not been checking her blood sugars at home because her meter broke.  With uncontrolled sugars and needing to adjust her insulin frequently, will have her start the Dexcom G7 continuous glucose monitor.  Will have her increase her Tresiba to 15 units daily.  Will switch her Trulicity to Ozempic 0.25 mg injection weekly for 4 weeks, then increase to 0.5 mg injection weekly.  Continue glipizide XL 10 mg daily, how ever could consider stopping this and adjusting other medications.  Encouraged her to write down her blood sugars, she may need to start short acting insulin with meals.  Will have her follow-up in 2 weeks. Discussed signs of low blood sugars and that the continue glucose monitor with help alarm with this.

## 2022-12-16 NOTE — Assessment & Plan Note (Signed)
Start ozempic 0.25mg  injection weekly for 4 weeks, then increase to 0.5mg  injection weekly.

## 2022-12-16 NOTE — Assessment & Plan Note (Signed)
Continue glipizide xl 10mg  daily

## 2022-12-22 MED ORDER — INSULIN ASPART 100 UNIT/ML IJ SOLN: 4.0000 [IU] | Freq: Every day | INTRAMUSCULAR | 99 refills | Status: DC

## 2022-12-22 MED ORDER — TRESIBA FLEXTOUCH 100 UNIT/ML ~~LOC~~ SOPN: 25.0000 [IU] | PEN_INJECTOR | Freq: Every day | SUBCUTANEOUS | 0 refills | Status: DC

## 2022-12-28 ENCOUNTER — Ambulatory Visit: Payer: 59 | Admitting: Nurse Practitioner

## 2023-01-14 ENCOUNTER — Other Ambulatory Visit: Payer: Self-pay | Admitting: Family

## 2023-01-14 MED ORDER — PEN NEEDLES 32G X 5 MM MISC: 1.0000 | Freq: Every day | 1 refills | Status: AC

## 2023-02-07 ENCOUNTER — Ambulatory Visit: Payer: 59 | Admitting: Nurse Practitioner

## 2023-02-09 ENCOUNTER — Other Ambulatory Visit: Payer: Self-pay | Admitting: Nurse Practitioner

## 2023-03-19 ENCOUNTER — Other Ambulatory Visit: Payer: Self-pay | Admitting: Nurse Practitioner

## 2023-03-21 ENCOUNTER — Encounter: Payer: Self-pay | Admitting: Nurse Practitioner

## 2023-03-23 ENCOUNTER — Other Ambulatory Visit: Payer: Self-pay | Admitting: Nurse Practitioner

## 2023-03-23 MED ORDER — TRESIBA FLEXTOUCH 100 UNIT/ML ~~LOC~~ SOPN: 30.0000 [IU] | PEN_INJECTOR | Freq: Every day | SUBCUTANEOUS | 0 refills | Status: DC

## 2023-03-23 NOTE — Telephone Encounter (Signed)
Requesting: TRESIBA FLEXTOUCH 100 UNIT/ML  Last Visit: 12/15/2022 Next Visit: Visit date not found Last Refill: 12/22/2022  Please Advise   Patient was to follow up in 2 weeks from last visit and canceled on 12/28/2022

## 2023-03-24 ENCOUNTER — Encounter (INDEPENDENT_AMBULATORY_CARE_PROVIDER_SITE_OTHER): Payer: Self-pay

## 2023-03-24 NOTE — Telephone Encounter (Signed)
Appt made

## 2023-04-05 ENCOUNTER — Other Ambulatory Visit: Payer: Self-pay | Admitting: Nurse Practitioner

## 2023-04-05 ENCOUNTER — Encounter: Payer: Self-pay | Admitting: Nurse Practitioner

## 2023-04-05 ENCOUNTER — Ambulatory Visit (INDEPENDENT_AMBULATORY_CARE_PROVIDER_SITE_OTHER): Payer: 59 | Admitting: Nurse Practitioner

## 2023-04-05 VITALS — BP 110/80 | HR 93 | Temp 97.9°F | Ht 65.0 in | Wt 201.0 lb

## 2023-04-05 DIAGNOSIS — R1013 Epigastric pain: Secondary | ICD-10-CM

## 2023-04-05 DIAGNOSIS — Z1211 Encounter for screening for malignant neoplasm of colon: Secondary | ICD-10-CM

## 2023-04-05 DIAGNOSIS — G4700 Insomnia, unspecified: Secondary | ICD-10-CM | POA: Insufficient documentation

## 2023-04-05 DIAGNOSIS — F419 Anxiety disorder, unspecified: Secondary | ICD-10-CM

## 2023-04-05 DIAGNOSIS — Z7985 Long-term (current) use of injectable non-insulin antidiabetic drugs: Secondary | ICD-10-CM

## 2023-04-05 DIAGNOSIS — Z794 Long term (current) use of insulin: Secondary | ICD-10-CM

## 2023-04-05 DIAGNOSIS — F5101 Primary insomnia: Secondary | ICD-10-CM

## 2023-04-05 DIAGNOSIS — R002 Palpitations: Secondary | ICD-10-CM

## 2023-04-05 DIAGNOSIS — I10 Essential (primary) hypertension: Secondary | ICD-10-CM

## 2023-04-05 DIAGNOSIS — E1140 Type 2 diabetes mellitus with diabetic neuropathy, unspecified: Secondary | ICD-10-CM

## 2023-04-05 DIAGNOSIS — Z7984 Long term (current) use of oral hypoglycemic drugs: Secondary | ICD-10-CM

## 2023-04-05 DIAGNOSIS — E559 Vitamin D deficiency, unspecified: Secondary | ICD-10-CM | POA: Diagnosis not present

## 2023-04-05 LAB — LIPID PANEL
Cholesterol: 146 mg/dL (ref 0–200)
HDL: 49.8 mg/dL (ref >39.00)
Total CHOL/HDL Ratio: 3
Triglycerides: 524 mg/dL — ABNORMAL HIGH (ref 0.0–149.0)

## 2023-04-05 LAB — CBC WITH DIFFERENTIAL/PLATELET
Basophils Absolute: 0 10*3/uL (ref 0.0–0.1)
Basophils Relative: 0.7 % (ref 0.0–3.0)
Eosinophils Absolute: 0.4 10*3/uL (ref 0.0–0.7)
Eosinophils Relative: 4.8 % (ref 0.0–5.0)
HCT: 50.2 % — ABNORMAL HIGH (ref 36.0–46.0)
Hemoglobin: 16.7 g/dL — ABNORMAL HIGH (ref 12.0–15.0)
Lymphocytes Relative: 35.3 % (ref 12.0–46.0)
Lymphs Abs: 2.6 10*3/uL (ref 0.7–4.0)
MCHC: 33.2 g/dL (ref 30.0–36.0)
MCV: 89.4 fL (ref 78.0–100.0)
Monocytes Absolute: 0.5 10*3/uL (ref 0.1–1.0)
Monocytes Relative: 7.2 % (ref 3.0–12.0)
Neutro Abs: 3.8 10*3/uL (ref 1.4–7.7)
Neutrophils Relative %: 52 % (ref 43.0–77.0)
Platelets: 286 10*3/uL (ref 150.0–400.0)
RBC: 5.62 Mil/uL — ABNORMAL HIGH (ref 3.87–5.11)
RDW: 13.4 % (ref 11.5–15.5)
WBC: 7.3 10*3/uL (ref 4.0–10.5)

## 2023-04-05 LAB — COMPREHENSIVE METABOLIC PANEL
ALT: 40 U/L — ABNORMAL HIGH (ref 0–35)
AST: 28 U/L (ref 0–37)
Albumin: 4.3 g/dL (ref 3.5–5.2)
Alkaline Phosphatase: 98 U/L (ref 39–117)
BUN: 12 mg/dL (ref 6–23)
CO2: 24 meq/L (ref 19–32)
Calcium: 9.4 mg/dL (ref 8.4–10.5)
Chloride: 102 meq/L (ref 96–112)
Creatinine, Ser: 0.8 mg/dL (ref 0.40–1.20)
GFR: 77.76 mL/min (ref >60.00)
Glucose, Bld: 258 mg/dL — ABNORMAL HIGH (ref 70–99)
Potassium: 4 meq/L (ref 3.5–5.1)
Sodium: 137 meq/L (ref 135–145)
Total Bilirubin: 0.5 mg/dL (ref 0.2–1.2)
Total Protein: 6.7 g/dL (ref 6.0–8.3)

## 2023-04-05 LAB — LDL CHOLESTEROL, DIRECT: Direct LDL: 51 mg/dL

## 2023-04-05 LAB — VITAMIN D 25 HYDROXY (VIT D DEFICIENCY, FRACTURES): VITD: 16.94 ng/mL — ABNORMAL LOW (ref 30.00–100.00)

## 2023-04-05 LAB — TSH: TSH: 6.77 u[IU]/mL — ABNORMAL HIGH (ref 0.35–5.50)

## 2023-04-05 LAB — LIPASE: Lipase: 21 U/L (ref 11.0–59.0)

## 2023-04-05 MED ORDER — DULOXETINE HCL 30 MG PO CPEP: 30.0000 mg | ORAL_CAPSULE | Freq: Every day | ORAL | 3 refills | Status: DC

## 2023-04-05 MED ORDER — LOSARTAN POTASSIUM 25 MG PO TABS: 25.0000 mg | ORAL_TABLET | Freq: Every day | ORAL | 1 refills | Status: DC

## 2023-04-05 MED ORDER — ROSUVASTATIN CALCIUM 40 MG PO TABS: 40.0000 mg | ORAL_TABLET | Freq: Every day | ORAL | 3 refills | Status: DC

## 2023-04-05 MED ORDER — EZETIMIBE 10 MG PO TABS: 10.0000 mg | ORAL_TABLET | Freq: Every day | ORAL | 3 refills | Status: DC

## 2023-04-05 MED ORDER — TRAZODONE HCL 50 MG PO TABS: 25.0000 mg | ORAL_TABLET | Freq: Every evening | ORAL | 3 refills | Status: DC | PRN

## 2023-04-05 NOTE — Assessment & Plan Note (Signed)
Chronic, ongoing. Continue cymbalta 30mg  daily. Will add trazodone at bedtime to help with insomnia.

## 2023-04-05 NOTE — Assessment & Plan Note (Signed)
Check vitamin D today and treat based on results.  °

## 2023-04-05 NOTE — Assessment & Plan Note (Signed)
She reports frequent nocturnal awakenings and difficulty returning to sleep, with current use of hydroxyzine proving ineffective. We will discontinue hydroxyzine and start Trazodone 50mg  at bedtime to assist with sleep and quieting racing thoughts.

## 2023-04-05 NOTE — Assessment & Plan Note (Signed)
Chronic, not controlled. Her A1c has improved significantly from >15.5 to 11.3 following adjustments to her insulin regimen by endocrinology. She reports nocturnal eating habits that may be contributing to elevated blood glucose levels. We will continue Tresiba 30 units daily, Ozempic 0.5mg  weekly, and sliding scale Novolog with meals. She is encouraged to maintain dietary changes and consider strategies to manage nocturnal eating habits. Continue losartan 25mg  daily. Check CMP, CBC, lipid panel today.

## 2023-04-05 NOTE — Patient Instructions (Signed)
It was great to see you!  Stop the hydroxyzine and start trazodone 1/2 tablet to 1 tablet at bedtime for sleep   We are checking your labs today and will let you know the results via mychart/phone.   Keep following with endocrinology   Let's follow-up in 2-3 months, sooner if you have concerns.  If a referral was placed today, you will be contacted for an appointment. Please note that routine referrals can sometimes take up to 3-4 weeks to process. Please call our office if you haven't heard anything after this time frame.  Take care,  Rodman Pickle, NP

## 2023-04-05 NOTE — Assessment & Plan Note (Signed)
She reports palpitations for the past six months, particularly noticeable with deep breathing. We will order an EKG due to the palpitations and it being four years since the last one. A TSH level will be checked to rule out thyroid dysfunction as a cause of the palpitations.

## 2023-04-05 NOTE — Assessment & Plan Note (Addendum)
Continue ozempic 0.5mg  weekly

## 2023-04-05 NOTE — Assessment & Plan Note (Signed)
Continue glipizide xl 10mg  daily

## 2023-04-05 NOTE — Progress Notes (Signed)
EKG interpreted by me on 04/05/23 showed normal sinus rhythm with a heart rate of 87, no ST or T wave changes.

## 2023-04-05 NOTE — Progress Notes (Signed)
Established Patient Office Visit  Subjective   Patient ID: Sara Banks, female    DOB: 1961/05/03  Age: 62 y.o. MRN: [id number]  Chief Complaint  Patient presents with   Medication Management    Rx Refills    HPI  Discussed the use of AI scribe software for clinical note transcription with the patient, who gave verbal consent to proceed.  History of Present Illness   Sara Banks, a patient with a history of diabetes and hypertension, presents with concerns about sleep disturbances and palpitations.    She reports waking up frequently at night and using food, particularly chocolate, to help her fall back asleep. She is also experiencing anxiety due to fear of crime in her neighborhood. She feels like her mind is racing when she wakes up.    She also reports feeling weak in her arms and has been experiencing palpitations for the past six months. She denies chest pain and shortness of breath. She thinks it may be related to anxiety. She denies weakness on one side of her body, slurred speech, and confusion.    She is concerned about her pancreas due to back pain and epigastric pain. She was supposed to get an EGD with GI, however her sugars were too high for anesthesia.   She states that her sugars are doing better. She has been taking multiple medications including Tresiba, Ozempic, Novolog insulin, losartan, and glipizide. She reports that her most recent A1c was 11.3, which is an improvement from >15.5. She has started following with endocrinology. She does experience neuropathy in her feet. She has not had an eye exam.        ROS See pertinent positives and negatives per HPI.    Objective:     BP 110/80 (BP Location: Left Arm)   Pulse 93   Temp 97.9 F (36.6 C)   Ht 5\' 5"  (1.651 m)   Wt 201 lb (91.2 kg)   SpO2 96%   BMI 33.45 kg/m    Physical Exam Vitals and nursing note reviewed.  Constitutional:      General: She is not in acute distress.    Appearance:  Normal appearance.  HENT:     Head: Normocephalic.  Eyes:     Conjunctiva/sclera: Conjunctivae normal.     Pupils: Pupils are equal, round, and reactive to light.  Cardiovascular:     Rate and Rhythm: Normal rate and regular rhythm.     Pulses: Normal pulses.     Heart sounds: Normal heart sounds.  Pulmonary:     Effort: Pulmonary effort is normal.     Breath sounds: Normal breath sounds.  Abdominal:     Palpations: Abdomen is soft.     Tenderness: There is no abdominal tenderness.  Musculoskeletal:     Cervical back: Normal range of motion.  Skin:    General: Skin is warm.  Neurological:     General: No focal deficit present.     Mental Status: She is alert and oriented to person, place, and time.     Cranial Nerves: No cranial nerve deficit.     Motor: No weakness.  Psychiatric:        Mood and Affect: Mood normal.        Behavior: Behavior normal.        Thought Content: Thought content normal.        Judgment: Judgment normal.     Assessment & Plan:   Problem List Items Addressed This Visit  Endocrine   Type 2 diabetes mellitus with diabetic neuropathy, with long-term current use of insulin (HCC) - Primary    Chronic, not controlled. Her A1c has improved significantly from >15.5 to 11.3 following adjustments to her insulin regimen by endocrinology. She reports nocturnal eating habits that may be contributing to elevated blood glucose levels. We will continue Tresiba 30 units daily, Ozempic 0.5mg  weekly, and sliding scale Novolog with meals. She is encouraged to maintain dietary changes and consider strategies to manage nocturnal eating habits. Continue losartan 25mg  daily. Check CMP, CBC, lipid panel today.       Relevant Medications   losartan (COZAAR) 25 MG tablet   rosuvastatin (CRESTOR) 40 MG tablet   Other Relevant Orders   CBC with Differential/Platelet   Comprehensive metabolic panel   Lipid panel     Other   Vitamin D deficiency    Check vitamin D  today and treat based on results.       Relevant Orders   VITAMIN D 25 Hydroxy (Vit-D Deficiency, Fractures)   Anxiety    Chronic, ongoing. Continue cymbalta 30mg  daily. Will add trazodone at bedtime to help with insomnia.       Relevant Medications   traZODone (DESYREL) 50 MG tablet   DULoxetine (CYMBALTA) 30 MG capsule   Long term current use of oral hypoglycemic drug    Continue glipizide xl 10mg  daily      Long-term current use of injectable noninsulin antidiabetic medication    Continue ozempic 0.5mg  weekly      Primary insomnia    She reports frequent nocturnal awakenings and difficulty returning to sleep, with current use of hydroxyzine proving ineffective. We will discontinue hydroxyzine and start Trazodone 50mg  at bedtime to assist with sleep and quieting racing thoughts.      Palpitations    She reports palpitations for the past six months, particularly noticeable with deep breathing. We will order an EKG due to the palpitations and it being four years since the last one. A TSH level will be checked to rule out thyroid dysfunction as a cause of the palpitations.      Relevant Orders   CBC with Differential/Platelet   Comprehensive metabolic panel   TSH   EKG 12-Lead (Completed)   Other Visit Diagnoses     Screen for colon cancer       Cologuard ordered today   Relevant Orders   Cologuard   Epigastric pain       She reports intermittent abdominal pain radiating to the back. We will check CMP and lipase today. May need to stop ozempic if elevated   Relevant Orders   Comprehensive metabolic panel   Lipase   Essential hypertension       Relevant Medications   ezetimibe (ZETIA) 10 MG tablet   losartan (COZAAR) 25 MG tablet   rosuvastatin (CRESTOR) 40 MG tablet         Return in about 3 months (around 07/06/2023) for 2-3 months, Anxiety, insomnia.    Gerre Scull, NP

## 2023-04-07 ENCOUNTER — Encounter: Payer: Self-pay | Admitting: Nurse Practitioner

## 2023-04-07 MED ORDER — FENOFIBRATE 145 MG PO TABS: 145.0000 mg | ORAL_TABLET | Freq: Every day | ORAL | 0 refills | Status: DC

## 2023-04-07 MED ORDER — VITAMIN D (ERGOCALCIFEROL) 1.25 MG (50000 UNIT) PO CAPS: 50000.0000 [IU] | ORAL_CAPSULE | ORAL | 0 refills | Status: DC

## 2023-04-07 NOTE — Addendum Note (Signed)
Addended by: Rodman Pickle A on: 04/07/2023 04:35 PM   Modules accepted: Orders

## 2023-04-10 ENCOUNTER — Other Ambulatory Visit: Payer: Self-pay | Admitting: Nurse Practitioner

## 2023-04-10 DIAGNOSIS — F419 Anxiety disorder, unspecified: Secondary | ICD-10-CM

## 2023-04-12 ENCOUNTER — Other Ambulatory Visit: Payer: Self-pay | Admitting: Nurse Practitioner

## 2023-04-12 NOTE — Telephone Encounter (Signed)
I called and spoke with patient and she said that she did not request this refill. Rx was discontinued on 04/05/23 and Trazodone was prescribed.

## 2023-04-13 NOTE — Telephone Encounter (Signed)
Noted  

## 2023-04-15 NOTE — Telephone Encounter (Signed)
Will wait and see if she needs me to send these in again.

## 2023-04-15 NOTE — Telephone Encounter (Signed)
Called pharmacy spoke to Kaylor, was advised that medication was picked up on 04/11/23, they checked the signature log and it was signed by last name Furno, first initial C.   She will have to file a reoprt of missing or stollen medications with her insurance to see if they will cover to have them filled again.   Spoke to patent, she states that she filed a complaint with the board of pharmacy and had spoken to the pharmacy already and told them that it was not her that picked up her meds.   She will have to pay for the medications with out insurance.  She is going to call the insurance company to see what can be done to help since her deductible is covered for the year.  Dm/cma

## 2023-04-18 LAB — COLOGUARD: COLOGUARD: NEGATIVE

## 2023-04-29 ENCOUNTER — Other Ambulatory Visit: Payer: Self-pay | Admitting: Nurse Practitioner

## 2023-05-24 ENCOUNTER — Encounter: Payer: Self-pay | Admitting: Nurse Practitioner

## 2023-06-04 ENCOUNTER — Other Ambulatory Visit: Payer: Self-pay | Admitting: Nurse Practitioner

## 2023-06-06 ENCOUNTER — Other Ambulatory Visit: Payer: Self-pay | Admitting: Nurse Practitioner

## 2023-06-06 NOTE — Telephone Encounter (Signed)
Requesting: Elmer Picker SENSOR  Last Visit: 04/05/2023 Next Visit: 08/15/2023 Last Refill: 12/15/2022  Please Advise

## 2023-06-07 MED ORDER — GLIPIZIDE ER 10 MG PO TB24: 10.0000 mg | ORAL_TABLET | Freq: Every day | ORAL | 2 refills | Status: DC

## 2023-06-07 NOTE — Telephone Encounter (Signed)
Requesting: Glipizide 10mg  Last Visit: 04/05/2023 Next Visit: 06/04/2023 Last Refill: 03/01/2022 by Historical Provider  Please Advise

## 2023-06-20 LAB — HEMOGLOBIN A1C: Hemoglobin A1C: 10.2

## 2023-06-22 ENCOUNTER — Other Ambulatory Visit: Payer: Self-pay | Admitting: Medical Genetics

## 2023-07-06 ENCOUNTER — Ambulatory Visit: Payer: 59 | Admitting: Nurse Practitioner

## 2023-07-07 ENCOUNTER — Other Ambulatory Visit: Payer: Self-pay | Admitting: Nurse Practitioner

## 2023-07-07 NOTE — Telephone Encounter (Signed)
Requesting: FENOFIBRATE 145 MG TABLET  Last Visit: 04/05/2023 Next Visit: 08/15/2023 Last Refill: 04/07/2023  Please Advise

## 2023-08-06 ENCOUNTER — Other Ambulatory Visit: Payer: Self-pay | Admitting: Nurse Practitioner

## 2023-08-08 NOTE — Telephone Encounter (Signed)
 Note to Pharmacy: Will need future refills from endocrinologist.

## 2023-08-08 NOTE — Telephone Encounter (Addendum)
 Requesting: TRESIBA FLEXTOUCH 100 UNIT/ML, DEXCOM G7 SENSOR  Last Visit: 04/05/2023 Next Visit: 08/15/2023 Last Refill: 03/23/2023, 12/15/2022  Please Advise   Note to Pharmacy: Will need future refills from endocrinologist. For FlexTouch

## 2023-08-15 ENCOUNTER — Encounter: Payer: Self-pay | Admitting: Nurse Practitioner

## 2023-08-15 ENCOUNTER — Ambulatory Visit (INDEPENDENT_AMBULATORY_CARE_PROVIDER_SITE_OTHER): Payer: 59 | Admitting: Nurse Practitioner

## 2023-08-15 ENCOUNTER — Telehealth: Payer: Self-pay | Admitting: Nurse Practitioner

## 2023-08-15 VITALS — BP 126/84 | HR 81 | Temp 97.4°F | Ht 65.0 in | Wt 206.6 lb

## 2023-08-15 DIAGNOSIS — F5101 Primary insomnia: Secondary | ICD-10-CM

## 2023-08-15 DIAGNOSIS — E559 Vitamin D deficiency, unspecified: Secondary | ICD-10-CM | POA: Diagnosis not present

## 2023-08-15 DIAGNOSIS — Z7985 Long-term (current) use of injectable non-insulin antidiabetic drugs: Secondary | ICD-10-CM

## 2023-08-15 DIAGNOSIS — E782 Mixed hyperlipidemia: Secondary | ICD-10-CM | POA: Insufficient documentation

## 2023-08-15 DIAGNOSIS — Z7984 Long term (current) use of oral hypoglycemic drugs: Secondary | ICD-10-CM

## 2023-08-15 DIAGNOSIS — G8929 Other chronic pain: Secondary | ICD-10-CM

## 2023-08-15 DIAGNOSIS — E1140 Type 2 diabetes mellitus with diabetic neuropathy, unspecified: Secondary | ICD-10-CM

## 2023-08-15 DIAGNOSIS — M25562 Pain in left knee: Secondary | ICD-10-CM | POA: Diagnosis not present

## 2023-08-15 DIAGNOSIS — R1310 Dysphagia, unspecified: Secondary | ICD-10-CM

## 2023-08-15 DIAGNOSIS — Z794 Long term (current) use of insulin: Secondary | ICD-10-CM | POA: Diagnosis not present

## 2023-08-15 DIAGNOSIS — L299 Pruritus, unspecified: Secondary | ICD-10-CM | POA: Insufficient documentation

## 2023-08-15 DIAGNOSIS — E785 Hyperlipidemia, unspecified: Secondary | ICD-10-CM | POA: Insufficient documentation

## 2023-08-15 LAB — COMPREHENSIVE METABOLIC PANEL
ALT: 23 U/L (ref 0–35)
AST: 18 U/L (ref 0–37)
Albumin: 4.3 g/dL (ref 3.5–5.2)
Alkaline Phosphatase: 67 U/L (ref 39–117)
BUN: 13 mg/dL (ref 6–23)
CO2: 25 meq/L (ref 19–32)
Calcium: 9.4 mg/dL (ref 8.4–10.5)
Chloride: 103 meq/L (ref 96–112)
Creatinine, Ser: 0.91 mg/dL (ref 0.40–1.20)
GFR: 66.45 mL/min (ref >60.00)
Glucose, Bld: 265 mg/dL — ABNORMAL HIGH (ref 70–99)
Potassium: 4.3 meq/L (ref 3.5–5.1)
Sodium: 136 meq/L (ref 135–145)
Total Bilirubin: 0.5 mg/dL (ref 0.2–1.2)
Total Protein: 6.7 g/dL (ref 6.0–8.3)

## 2023-08-15 LAB — CBC WITH DIFFERENTIAL/PLATELET
Basophils Absolute: 0 10*3/uL (ref 0.0–0.1)
Basophils Relative: 0.7 % (ref 0.0–3.0)
Eosinophils Absolute: 0.3 10*3/uL (ref 0.0–0.7)
Eosinophils Relative: 4.1 % (ref 0.0–5.0)
HCT: 48.5 % — ABNORMAL HIGH (ref 36.0–46.0)
Hemoglobin: 16.5 g/dL — ABNORMAL HIGH (ref 12.0–15.0)
Lymphocytes Relative: 30.2 % (ref 12.0–46.0)
Lymphs Abs: 2.2 10*3/uL (ref 0.7–4.0)
MCHC: 34 g/dL (ref 30.0–36.0)
MCV: 90.5 fl (ref 78.0–100.0)
Monocytes Absolute: 0.4 10*3/uL (ref 0.1–1.0)
Monocytes Relative: 6 % (ref 3.0–12.0)
Neutro Abs: 4.4 10*3/uL (ref 1.4–7.7)
Neutrophils Relative %: 59 % (ref 43.0–77.0)
Platelets: 279 10*3/uL (ref 150.0–400.0)
RBC: 5.36 Mil/uL — ABNORMAL HIGH (ref 3.87–5.11)
RDW: 13.2 % (ref 11.5–15.5)
WBC: 7.4 10*3/uL (ref 4.0–10.5)

## 2023-08-15 LAB — LIPID PANEL
Cholesterol: 139 mg/dL (ref 0–200)
HDL: 56.8 mg/dL (ref >39.00)
LDL Cholesterol: 47 mg/dL (ref 0–99)
NonHDL: 82.63
Total CHOL/HDL Ratio: 2
Triglycerides: 180 mg/dL — ABNORMAL HIGH (ref 0.0–149.0)
VLDL: 36 mg/dL (ref 0.0–40.0)

## 2023-08-15 LAB — VITAMIN D 25 HYDROXY (VIT D DEFICIENCY, FRACTURES): VITD: 34.76 ng/mL (ref 30.00–100.00)

## 2023-08-15 LAB — MICROALBUMIN / CREATININE URINE RATIO
Creatinine,U: 115.8 mg/dL
Microalb Creat Ratio: 24.3 mg/g (ref 0.0–30.0)
Microalb, Ur: 2.8 mg/dL — ABNORMAL HIGH (ref 0.0–1.9)

## 2023-08-15 MED ORDER — TRAZODONE HCL 100 MG PO TABS: 100.0000 mg | ORAL_TABLET | Freq: Every day | ORAL | 0 refills | Status: DC

## 2023-08-15 MED ORDER — GLIPIZIDE ER 10 MG PO TB24: 10.0000 mg | ORAL_TABLET | Freq: Every day | ORAL | 0 refills | Status: DC

## 2023-08-15 NOTE — Assessment & Plan Note (Signed)
 Currently on Fenofibrate 145mg  daily and rosuvastatin 40mg  daily, previously on Zetia. Check lipid panel and consider reinitiating Zetia based on results.

## 2023-08-15 NOTE — Patient Instructions (Addendum)
 It was great to see you!  We are checking your labs today and will let you know the results via mychart/phone.   Start claritin or zyrtec 10mg  once a day   Increase your trazodone to 100mg  daily. You can take 2 of your current pills, then go back down to 1 when you get the new bottle   Talk to your GI (stomach doctor) about your trouble swallowing  I have placed a referral to ortho for your knee  Keep appointments with your endocrinologist.   Let's follow-up in 3 months, soon with concerns   Take care,  Rodman Pickle, NP

## 2023-08-15 NOTE — Assessment & Plan Note (Signed)
Continue ozempic 0.5mg  weekly

## 2023-08-15 NOTE — Assessment & Plan Note (Signed)
 Chronic, not controlled. Currently taking 34 units of Tresiba, sliding scale novolog, 0.5 units of Ozempic weekly, and Glipizide XL 10 mg daily. Recent A1C improved from over 15 to 10.2. Continue the current regimen and collaboration with endocrinology. Encourage regular exercise and a healthy diet. Check CMP, CBC, and urine microalbumin today. Eye exam up to date. Declines pneumonia vaccine.

## 2023-08-15 NOTE — Assessment & Plan Note (Signed)
 Chronic, not controlled. Difficulty staying asleep continues despite taking Trazodone 50mg . Increase Trazodone to 100mg  nightly.

## 2023-08-15 NOTE — Assessment & Plan Note (Signed)
Check vitamin D today and treat based on results.  °

## 2023-08-15 NOTE — Assessment & Plan Note (Signed)
Continue glipizide xl 10mg  daily

## 2023-08-15 NOTE — Assessment & Plan Note (Signed)
 Chronic itching without rash, with an increase in skin tags. Start Claritin or Zyrtec 10mg  daily. Consider moisturizing eye drops for itchy eyes.

## 2023-08-15 NOTE — Assessment & Plan Note (Signed)
 Chronic pain and mobility issues due to a previous fracture and missing ACL/meniscus, with reported nerve pain and difficulty straightening the leg. Refer to an orthopedic specialist for further evaluation and management.

## 2023-08-15 NOTE — Telephone Encounter (Signed)
 error

## 2023-08-15 NOTE — Assessment & Plan Note (Signed)
 Chronic issue with food getting stuck in the throat persists, though there is no difficulty with liquids. An endoscopy has not been performed recently due to elevated blood sugars. Schedule an appointment with a gastroenterologist for further evaluation and possible endoscopy.

## 2023-08-15 NOTE — Progress Notes (Signed)
 Established Patient Office Visit  Subjective   Patient ID: Sara Banks, female    DOB: 1960-11-27  Age: 63 y.o. MRN: [id number]  Chief Complaint  Patient presents with   Anxiety    Follow up, insomnia, stiffness in hands, food gets caught in throat    HPI  Discussed the use of AI scribe software for clinical note transcription with the patient, who gave verbal consent to proceed.  History of Present Illness   The patient, with a history of diabetes, presents with multiple complaints. She recently recovered from a severe sinus infection characterized by excessive mucus production and a persistent cough. The patient also reports difficulty swallowing food, which she describes as food getting stuck in her throat. This has been ongoing for a while and is associated with all types of food. She was scheduled for an endoscopy, however it was cancelled due to her sugars being 600. She denies trouble swallowing liquids.   The patient's diabetes management appears to be a challenge. She reports taking 34 units of tresiba and sliding scale of novolog, ozempic 0.5mg  weekly, and glipizide XL 10mg  daily, but her blood sugar levels remain high. She has started working out and has made dietary changes to help control her blood sugar levels. She denies chest pain and only experiences shortness of breath when she smokes.   Emotional stress is a significant concern for the patient, primarily due to issues with her adult children. This has led to sleep disturbances, with the patient reporting difficulty staying asleep despite taking trazodone.  The patient also reports generalized itching, which she has been trying to associate with a potential trigger such as food or medication. She has noticed an increase in skin tags and is concerned about a potential link to her liver enzymes. She denies any rashes.   The patient has a history of a fractured patella and currently experiences knee pain. She describes  a nerve or muscle moving in her knee, causing excruciating pain when walking. The patient uses cloth knee braces for support but reports minimal relief. She would like a referral to orthopedics       ROS See pertinent positives and negatives per HPI.    Objective:     BP 126/84 (BP Location: Right Arm, Patient Position: Sitting, Cuff Size: Normal)   Pulse 81   Temp (!) 97.4 F (36.3 C)   Ht 5\' 5"  (1.651 m)   Wt 206 lb 9.6 oz (93.7 kg)   SpO2 95%   BMI 34.38 kg/m    Physical Exam Vitals and nursing note reviewed.  Constitutional:      General: She is not in acute distress.    Appearance: Normal appearance.  HENT:     Head: Normocephalic.  Eyes:     Conjunctiva/sclera: Conjunctivae normal.  Cardiovascular:     Rate and Rhythm: Normal rate and regular rhythm.     Pulses: Normal pulses.     Heart sounds: Normal heart sounds.  Pulmonary:     Effort: Pulmonary effort is normal.     Breath sounds: Normal breath sounds.  Musculoskeletal:     Cervical back: Normal range of motion.  Skin:    General: Skin is warm.  Neurological:     General: No focal deficit present.     Mental Status: She is alert and oriented to person, place, and time.  Psychiatric:        Mood and Affect: Mood normal.  Behavior: Behavior normal.        Thought Content: Thought content normal.        Judgment: Judgment normal.      Results for orders placed or performed in visit on 08/15/23  Hemoglobin A1c  Result Value Ref Range   Hemoglobin A1C 10.2       The ASCVD Risk score (Arnett DK, et al., 2019) failed to calculate for the following reasons:   Risk score cannot be calculated because patient has a medical history suggesting prior/existing ASCVD    Assessment & Plan:   Problem List Items Addressed This Visit       Digestive   Dysphagia   Chronic issue with food getting stuck in the throat persists, though there is no difficulty with liquids. An endoscopy has not been  performed recently due to elevated blood sugars. Schedule an appointment with a gastroenterologist for further evaluation and possible endoscopy.        Endocrine   Type 2 diabetes mellitus with diabetic neuropathy, with long-term current use of insulin (HCC) - Primary   Chronic, not controlled. Currently taking 34 units of Tresiba, sliding scale novolog, 0.5 units of Ozempic weekly, and Glipizide XL 10 mg daily. Recent A1C improved from over 15 to 10.2. Continue the current regimen and collaboration with endocrinology. Encourage regular exercise and a healthy diet. Check CMP, CBC, and urine microalbumin today. Eye exam up to date. Declines pneumonia vaccine.       Relevant Medications   glipiZIDE (GLUCOTROL XL) 10 MG 24 hr tablet   Other Relevant Orders   CBC with Differential/Platelet   Comprehensive metabolic panel   Microalbumin / creatinine urine ratio     Musculoskeletal and Integument   Pruritus   Chronic itching without rash, with an increase in skin tags. Start Claritin or Zyrtec 10mg  daily. Consider moisturizing eye drops for itchy eyes.        Other   Vitamin D deficiency   Check vitamin D today and treat based on results.       Relevant Orders   VITAMIN D 25 Hydroxy (Vit-D Deficiency, Fractures)   Chronic pain of left knee   Chronic pain and mobility issues due to a previous fracture and missing ACL/meniscus, with reported nerve pain and difficulty straightening the leg. Refer to an orthopedic specialist for further evaluation and management.      Relevant Medications   traZODone (DESYREL) 100 MG tablet   Other Relevant Orders   Ambulatory referral to Orthopedic Surgery   Long term current use of oral hypoglycemic drug   Continue glipizide xl 10mg  daily      Long-term current use of injectable noninsulin antidiabetic medication   Continue ozempic 0.5mg  weekly      Primary insomnia   Chronic, not controlled. Difficulty staying asleep continues despite taking  Trazodone 50mg . Increase Trazodone to 100mg  nightly.      Mixed hyperlipidemia   Currently on Fenofibrate 145mg  daily and rosuvastatin 40mg  daily, previously on Zetia. Check lipid panel and consider reinitiating Zetia based on results.       Relevant Orders   CBC with Differential/Platelet   Lipid panel   Comprehensive metabolic panel    Return in about 3 months (around 11/15/2023) for CPE.    Gerre Scull, NP

## 2023-08-16 MED ORDER — EMPAGLIFLOZIN 10 MG PO TABS: 10.0000 mg | ORAL_TABLET | Freq: Every day | ORAL | 2 refills | Status: DC

## 2023-08-23 ENCOUNTER — Telehealth: Payer: Self-pay

## 2023-08-23 ENCOUNTER — Ambulatory Visit (INDEPENDENT_AMBULATORY_CARE_PROVIDER_SITE_OTHER): Admitting: Physician Assistant

## 2023-08-23 DIAGNOSIS — M1712 Unilateral primary osteoarthritis, left knee: Secondary | ICD-10-CM

## 2023-08-23 NOTE — Telephone Encounter (Signed)
Please submit for left knee gel inj °

## 2023-08-23 NOTE — Progress Notes (Signed)
 Office Visit Note   Patient: Sara Banks           Date of Birth: 03-10-1961           MRN: [id number] Visit Date: 08/23/2023              Requested by: Gerre Scull, NP [address] [place],  [place] [address] PCP: Gerre Scull, NP   Assessment & Plan: Visit Diagnoses:  1. Unilateral primary osteoarthritis, left knee     Plan: Impression is advanced left knee degenerative joint disease.  We have discussed various treatment options to include cortisone injection versus viscosupplementation injection versus total knee arthroplasty.  Unfortunately, most recent A1c from 2 months ago was 10.2.  This is to hide to proceed with cortisone injection.  She is not ready for total knee replacement.  She would like to get approval for viscosupplementation injection.  Follow-up once approved.  Follow-Up Instructions: Return for once approved for visco inj.    This patient is diagnosed with osteoarthritis of the knee(s).    Radiographs show evidence of joint space narrowing, osteophytes, subchondral sclerosis and/or subchondral cysts.  This patient has knee pain which interferes with functional and activities of daily living.    This patient has experienced inadequate response, adverse effects and/or intolerance with conservative treatments such as acetaminophen, NSAIDS, topical creams, physical therapy or regular exercise, knee bracing and/or weight loss.   This patient has experienced inadequate response or has a contraindication to intra articular steroid injections for at least 3 months.   This patient is not scheduled to have a total knee replacement within 6 months of starting treatment with viscosupplementation.  Orders:  No orders of the defined types were placed in this encounter.  No orders of the defined types were placed in this encounter.     Procedures: No procedures performed   Clinical Data: No additional findings.   Subjective: Chief Complaint   Patient presents with   Left Knee - Pain    HPI patient is a 63 year old female who comes in today with left knee pain.  She has had pain for several decades.  She notes that she is torn her ACL and meniscus in the past where she underwent surgery.  She also tells me she broke her patella last year.  She is having pain to the entire knee but worse to the medial aspect.  She feels a pinching sensation at times.  Pain is worse when she is walking and stair climbing.  She has been taking gabapentin and an occasional NSAID.  She thinks she had a cortisone injection about 30 years ago.  Of note, she is a type II diabetic with a hemoglobin A1c of 10.2 a few months ago.  No previous viscosupplementation injection.  Review of Systems as detailed in HPI.  All others reviewed and are negative.   Objective: Vital Signs: There were no vitals taken for this visit.  Physical Exam well-developed and well-nourished female in no acute distress.  Alert and oriented x 3.  Ortho Exam left knee exam: Small effusion.  Range of motion 0 to 100 degrees.  Minimal tenderness to the medial joint line.  Ligaments are stable.  She is neurovascularly intact distally.  Specialty Comments:  No specialty comments available.  Imaging: No new imaging   PMFS History: Patient Active Problem List   Diagnosis Date Noted   Mixed hyperlipidemia 08/15/2023   Pruritus 08/15/2023   Primary insomnia 04/05/2023  Palpitations 04/05/2023   Long term current use of oral hypoglycemic drug 12/16/2022   Long-term current use of injectable noninsulin antidiabetic medication 12/16/2022   Type 2 diabetes mellitus with diabetic neuropathy, with long-term current use of insulin (HCC) 05/26/2022   Dysphagia 05/26/2022   Anxiety 05/26/2022   History of CVA (cerebrovascular accident) 05/26/2022   Chronic pain of both shoulders 05/26/2022   Chronic pain of left knee 05/26/2022   Aortic atherosclerosis (HCC) 05/26/2022   Vitamin D  deficiency 07/26/2019   Chronic hepatitis C (HCC) 05/11/2013   Tobacco abuse 05/11/2013   Past Medical History:  Diagnosis Date   Acute ischemic stroke of right pons 05/11/2013   Anxiety    Arthritis    Chronic pain    Depression    Diabetes mellitus without complication (HCC)    GERD (gastroesophageal reflux disease)    Hepatitis C dx 2014   [facility]   Neuropathy    Stroke (cerebrum) (HCC)     Family History  Problem Relation Age of Onset   Cancer Mother        brain   Lung cancer Mother    Cancer Father        unsure wich kind   Colon polyps Father    Rheum arthritis Neg Hx    Colon cancer Neg Hx    Esophageal cancer Neg Hx     Past Surgical History:  Procedure Laterality Date   BREAST BIOPSY Left 06/30/2022   [place] LT BREAST BX W LOC DEV 1ST LESION IMG BX SPEC US GUIDE 06/30/2022 [facility]   KNEE ARTHROSCOPY W/ ACL RECONSTRUCTION     right knee x 3   TRANSTHORACIC ECHOCARDIOGRAM  2014   normal   Social History   Occupational History   Not on file  Tobacco Use   Smoking status: Every Day    Current packs/day: 0.50    Average packs/day: 0.5 packs/day for 38.0 years (19.0 ttl pk-yrs)    Types: Cigarettes   Smokeless tobacco: Never  Vaping Use   Vaping status: Never Used  Substance and Sexual Activity   Alcohol use: No    Comment: rarely once a year wine   Drug use: No   Sexual activity: Not Currently

## 2023-08-30 MED ORDER — LUBIPROSTONE 8 MCG PO CAPS: 8.0000 ug | ORAL_CAPSULE | Freq: Two times a day (BID) | ORAL | 5 refills | Status: DC

## 2023-08-30 NOTE — Telephone Encounter (Signed)
 Requesting: LUBIPROSTONE 8 MCG CAPSULES 2X DAILY WITH MEAL  Last Visit: 08/15/2023 Next Visit: 11/16/2023 Last Refill: 11/18/2022 by Willette Cluster, NP  Please Advise

## 2023-08-30 NOTE — Addendum Note (Signed)
 Addended by: Malena Peer Y on: 08/30/2023 11:01 AM   Modules accepted: Orders

## 2023-09-08 ENCOUNTER — Other Ambulatory Visit: Payer: Self-pay | Admitting: Family

## 2023-09-13 NOTE — Telephone Encounter (Signed)
 VOB submitted for Monovisc, left knee

## 2023-09-17 ENCOUNTER — Encounter: Payer: Self-pay | Admitting: Nurse Practitioner

## 2023-09-19 MED ORDER — EMPAGLIFLOZIN 10 MG PO TABS: 10.0000 mg | ORAL_TABLET | Freq: Every day | ORAL | 2 refills | Status: DC

## 2023-09-19 MED ORDER — OZEMPIC (0.25 OR 0.5 MG/DOSE) 2 MG/3ML ~~LOC~~ SOPN: 0.5000 mg | PEN_INJECTOR | SUBCUTANEOUS | 1 refills | Status: DC

## 2023-09-19 NOTE — Telephone Encounter (Signed)
 Requesting: Jardiance 10mg , Ozempic  Last Visit: 08/15/2023 Next Visit: 11/16/2023 Last Refill: 08/16/2023 with 2 refills, 06/06/2023  Please Advise

## 2023-10-04 ENCOUNTER — Other Ambulatory Visit: Payer: Self-pay | Admitting: Nurse Practitioner

## 2023-10-04 NOTE — Telephone Encounter (Signed)
 Requesting: FENOFIBRATE  145 MG TABLET  Last Visit: 08/15/2023 Next Visit: 11/16/2023 Last Refill: 07/07/2023  Please Advise

## 2023-10-05 NOTE — Telephone Encounter (Signed)
 Requesting: [place] SENSOR  Last Visit: 08/15/2023 Next Visit: 11/16/2023 Last Refill: 12/15/2022  Please Advise

## 2023-10-06 ENCOUNTER — Other Ambulatory Visit: Payer: Self-pay | Admitting: Nurse Practitioner

## 2023-10-06 NOTE — Telephone Encounter (Signed)
 Requesting: LUBIPROSTONE  8 MCG CAPSULE  Last Visit: 08/15/2023 Next Visit: 11/16/2023 Last Refill: 08/30/2023    Please Advise   Pharmacy comment: REQUEST FOR 90 DAYS PRESCRIPTION.

## 2023-10-10 ENCOUNTER — Other Ambulatory Visit: Payer: Self-pay | Admitting: Nurse Practitioner

## 2023-10-10 NOTE — Telephone Encounter (Signed)
 Requesting: OZEMPIC  0.25-0.5 MG/DOSE PEN  Last Visit: 08/15/2023 Next Visit: 11/16/2023 Last Refill:   Pharmacy comment: REQUEST FOR 90 DAYS PRESCRIPTION.    Please Advise

## 2023-11-03 ENCOUNTER — Other Ambulatory Visit: Payer: Self-pay | Admitting: Nurse Practitioner

## 2023-11-03 DIAGNOSIS — E1140 Type 2 diabetes mellitus with diabetic neuropathy, unspecified: Secondary | ICD-10-CM

## 2023-11-04 NOTE — Telephone Encounter (Signed)
 Requesting: GABAPENTIN  300 MG CAPSULE  Last Visit: 08/15/2023 Next Visit: 11/16/2023 Last Refill: 04/07/2023  Please Advise

## 2023-11-15 ENCOUNTER — Encounter: Admitting: Nurse Practitioner

## 2023-11-16 ENCOUNTER — Ambulatory Visit: Payer: Self-pay | Admitting: Nurse Practitioner

## 2023-11-16 ENCOUNTER — Ambulatory Visit: Admitting: Nurse Practitioner

## 2023-11-16 ENCOUNTER — Encounter: Payer: Self-pay | Admitting: Nurse Practitioner

## 2023-11-16 ENCOUNTER — Other Ambulatory Visit (HOSPITAL_COMMUNITY)
Admission: RE | Admit: 2023-11-16 | Discharge: 2023-11-16 | Disposition: A | Discharge status: Home / Self Care | Source: Ambulatory Visit | Attending: Nurse Practitioner | Admitting: Nurse Practitioner

## 2023-11-16 VITALS — BP 110/78 | HR 107 | Temp 97.5°F | Ht 65.0 in | Wt 200.0 lb

## 2023-11-16 DIAGNOSIS — Z794 Long term (current) use of insulin: Secondary | ICD-10-CM | POA: Diagnosis not present

## 2023-11-16 DIAGNOSIS — I700 Atherosclerosis of aorta: Secondary | ICD-10-CM | POA: Diagnosis not present

## 2023-11-16 DIAGNOSIS — Z124 Encounter for screening for malignant neoplasm of cervix: Secondary | ICD-10-CM

## 2023-11-16 DIAGNOSIS — E559 Vitamin D deficiency, unspecified: Secondary | ICD-10-CM

## 2023-11-16 DIAGNOSIS — Z0001 Encounter for general adult medical examination with abnormal findings: Secondary | ICD-10-CM

## 2023-11-16 DIAGNOSIS — M2550 Pain in unspecified joint: Secondary | ICD-10-CM | POA: Insufficient documentation

## 2023-11-16 DIAGNOSIS — E782 Mixed hyperlipidemia: Secondary | ICD-10-CM | POA: Diagnosis not present

## 2023-11-16 DIAGNOSIS — B182 Chronic viral hepatitis C: Secondary | ICD-10-CM

## 2023-11-16 DIAGNOSIS — E1140 Type 2 diabetes mellitus with diabetic neuropathy, unspecified: Secondary | ICD-10-CM | POA: Diagnosis not present

## 2023-11-16 DIAGNOSIS — F419 Anxiety disorder, unspecified: Secondary | ICD-10-CM

## 2023-11-16 DIAGNOSIS — Z1231 Encounter for screening mammogram for malignant neoplasm of breast: Secondary | ICD-10-CM

## 2023-11-16 DIAGNOSIS — Z780 Asymptomatic menopausal state: Secondary | ICD-10-CM

## 2023-11-16 DIAGNOSIS — Z7984 Long term (current) use of oral hypoglycemic drugs: Secondary | ICD-10-CM

## 2023-11-16 DIAGNOSIS — F5101 Primary insomnia: Secondary | ICD-10-CM

## 2023-11-16 LAB — COMPREHENSIVE METABOLIC PANEL WITH GFR
ALT: 21 U/L (ref 0–35)
AST: 16 U/L (ref 0–37)
Albumin: 4.5 g/dL (ref 3.5–5.2)
Alkaline Phosphatase: 64 U/L (ref 39–117)
BUN: 21 mg/dL (ref 6–23)
CO2: 24 meq/L (ref 19–32)
Calcium: 9.8 mg/dL (ref 8.4–10.5)
Chloride: 103 meq/L (ref 96–112)
Creatinine, Ser: 1.02 mg/dL (ref 0.40–1.20)
GFR: 57.85 mL/min — ABNORMAL LOW (ref >60.00)
Glucose, Bld: 268 mg/dL — ABNORMAL HIGH (ref 70–99)
Potassium: 3.9 meq/L (ref 3.5–5.1)
Sodium: 136 meq/L (ref 135–145)
Total Bilirubin: 0.5 mg/dL (ref 0.2–1.2)
Total Protein: 7.1 g/dL (ref 6.0–8.3)

## 2023-11-16 LAB — HEMOGLOBIN A1C: Hgb A1c MFr Bld: 9.5 % — ABNORMAL HIGH (ref 4.6–6.5)

## 2023-11-16 LAB — LIPID PANEL
Cholesterol: 158 mg/dL (ref 0–200)
HDL: 43.2 mg/dL (ref >39.00)
NonHDL: 115.19
Total CHOL/HDL Ratio: 4
Triglycerides: 522 mg/dL — ABNORMAL HIGH (ref 0.0–149.0)
VLDL: 104.4 mg/dL — ABNORMAL HIGH (ref 0.0–40.0)

## 2023-11-16 LAB — CBC WITH DIFFERENTIAL/PLATELET
Basophils Absolute: 0 10*3/uL (ref 0.0–0.1)
Basophils Relative: 0.6 % (ref 0.0–3.0)
Eosinophils Absolute: 0.2 10*3/uL (ref 0.0–0.7)
Eosinophils Relative: 3.3 % (ref 0.0–5.0)
HCT: 49.6 % — ABNORMAL HIGH (ref 36.0–46.0)
Hemoglobin: 16.7 g/dL — ABNORMAL HIGH (ref 12.0–15.0)
Lymphocytes Relative: 32.1 % (ref 12.0–46.0)
Lymphs Abs: 1.9 10*3/uL (ref 0.7–4.0)
MCHC: 33.8 g/dL (ref 30.0–36.0)
MCV: 89.7 fl (ref 78.0–100.0)
Monocytes Absolute: 0.5 10*3/uL (ref 0.1–1.0)
Monocytes Relative: 8.1 % (ref 3.0–12.0)
Neutro Abs: 3.3 10*3/uL (ref 1.4–7.7)
Neutrophils Relative %: 55.9 % (ref 43.0–77.0)
Platelets: 301 10*3/uL (ref 150.0–400.0)
RBC: 5.53 Mil/uL — ABNORMAL HIGH (ref 3.87–5.11)
RDW: 13.6 % (ref 11.5–15.5)
WBC: 5.9 10*3/uL (ref 4.0–10.5)

## 2023-11-16 LAB — SEDIMENTATION RATE: Sed Rate: 11 mm/h (ref 0–30)

## 2023-11-16 LAB — LDL CHOLESTEROL, DIRECT: Direct LDL: 54 mg/dL

## 2023-11-16 LAB — VITAMIN D 25 HYDROXY (VIT D DEFICIENCY, FRACTURES): VITD: 42.94 ng/mL (ref 30.00–100.00)

## 2023-11-16 LAB — C-REACTIVE PROTEIN: CRP: <1 mg/dL (ref 0.5–20.0)

## 2023-11-16 LAB — CK: Total CK: 47 U/L (ref 7–177)

## 2023-11-16 NOTE — Assessment & Plan Note (Signed)
 Chronic, not controlled. Glipizide  XL 10 mg daily and jardiance  10mg  daily. Continue the current regimen and collaboration with endocrinology. Encourage regular exercise and a healthy diet. Check CMP, CBC, and A1c today. Eye exam up to date. Declines pneumonia vaccine.

## 2023-11-16 NOTE — Assessment & Plan Note (Signed)
 She has been experiencing multiple joint and myalgias for the last 1-2 months. May be a side effect of jardiance . Stop this for 2 weeks and see if symptoms improve. Check ANA, RF, ESR, CRP, CK, and lyme titers today.

## 2023-11-16 NOTE — Assessment & Plan Note (Signed)
 Chronic, stable. Continue Fenofibrate  145mg  daily and rosuvastatin  40mg  daily.

## 2023-11-16 NOTE — Progress Notes (Signed)
 BP 110/78 (BP Location: Left Arm, Patient Position: Sitting, Cuff Size: Large)   Pulse (!) 107   Temp (!) 97.5 F (36.4 C)   Ht 5\' 5"  (1.651 m)   Wt 200 lb (90.7 kg)   SpO2 93%   BMI 33.28 kg/m    Subjective:    Patient ID: Sara Banks, female    DOB: 08-29-1960, 62 y.o.   MRN: [id number]  CC: Chief Complaint  Patient presents with   Annual Exam    With fasting lab work, concerns with neck pain/pressure and pain all over body for 2 months    HPI: Sara Banks is a 63 y.o. female presenting on 11/16/2023 for comprehensive medical examination. Current medical complaints include:joint and muscle pain  She has been experiencing multiple joint and muscle pains for the last 1-2 months. They are in her fingers, wrists, arms, shoulders, hips, knees, and legs. She denies falls, injuries. She did recently start jardiance . She has also been experiencing some headaches. She has been taking gabapetnin which has not helped the pain. She feels weak and has decreased hand grip with trouble opening bottles. She has experienced night sweats, but denies fevers. She does like to work outside in her yard. She denies chest pain and shortness of breath.   Menopausal Symptoms: no  Depression and Anxiety Screen done today and results listed below:     11/16/2023    8:30 AM 08/15/2023    9:09 AM 04/05/2023    9:07 AM 05/26/2022    8:04 AM 08/14/2020    9:07 AM  Depression screen PHQ 2/9  Decreased Interest 3 3 2  0 0  Down, Depressed, Hopeless 0 2 0 0 0  PHQ - 2 Score 3 5 2  0 0  Altered sleeping 3 3 3     Tired, decreased energy 3 3 3     Change in appetite 1 1 2     Feeling bad or failure about yourself  0 3 0    Trouble concentrating 3 3 0    Moving slowly or fidgety/restless 0 2 0    Suicidal thoughts 0 0 0    PHQ-9 Score 13 20 10     Difficult doing work/chores Extremely dIfficult Somewhat difficult Somewhat difficult        11/16/2023    8:31 AM 08/15/2023    9:09 AM 04/05/2023    9:08 AM   GAD 7 : Generalized Anxiety Score  Nervous, Anxious, on Edge 1 2 2   Control/stop worrying 3 3 3   Worry too much - different things 3 3 2   Trouble relaxing 3 3 3   Restless 2 1 2   Easily annoyed or irritable 3 1 0  Afraid - awful might happen 3 0 3  Total GAD 7 Score 18 13 15   Anxiety Difficulty Somewhat difficult Somewhat difficult Somewhat difficult    The patient does not have a history of falls. I did not complete a risk assessment for falls. A plan of care for falls was not documented.   Past Medical History:  Past Medical History:  Diagnosis Date   Acute ischemic stroke of right pons 05/11/2013   Anxiety    Arthritis    Chronic pain    Depression    Diabetes mellitus without complication (HCC)    GERD (gastroesophageal reflux disease)    Hepatitis C dx 2014   [facility]   Neuropathy    Stroke (cerebrum) [facility])     Surgical History:  Past  Surgical History:  Procedure Laterality Date   BREAST BIOPSY Left 06/30/2022   US  LT BREAST BX W LOC DEV 1ST LESION IMG BX SPEC US  GUIDE 06/30/2022 [facility]   KNEE ARTHROSCOPY W/ ACL RECONSTRUCTION     right knee x 3   TRANSTHORACIC ECHOCARDIOGRAM  2014   normal    Medications:  Current Outpatient Medications on File Prior to Visit  Medication Sig   Continuous Glucose Receiver (DEXCOM G7 RECEIVER) DEVI 1 each by Does not apply route continuous.   Continuous Glucose Sensor (DEXCOM G7 SENSOR) MISC 1 each by miscellaneous route.   Continuous Glucose Sensor (DEXCOM G7 SENSOR) MISC CHANGE SENSOR EVERY 10 DAYS   DULoxetine  (CYMBALTA ) 30 MG capsule Take 1 capsule (30 mg total) by mouth daily.   empagliflozin  (JARDIANCE ) 10 MG TABS tablet Take 1 tablet (10 mg total) by mouth daily before breakfast.   fenofibrate  (TRICOR ) 145 MG tablet TAKE 1 TABLET BY MOUTH EVERY DAY   gabapentin  (NEURONTIN ) 300 MG capsule TAKE 1 CAPSULE BY MOUTH THREE TIMES A DAY   glipiZIDE  (GLUCOTROL  XL) 10 MG 24 hr tablet Take 1 tablet (10 mg  total) by mouth daily.   insulin  aspart (NOVOLOG ) 100 UNIT/ML injection Inject 4 Units into the skin daily. Take once a day with your biggest meal   insulin  degludec (TRESIBA  FLEXTOUCH) 100 UNIT/ML FlexTouch Pen Inject 30 Units into the skin daily. (Patient taking differently: Inject 34 Units into the skin daily.)   Insulin  Pen Needle (PEN NEEDLES) 32G X 5 MM MISC 1 Needle by Does not apply route daily.   losartan  (COZAAR ) 25 MG tablet Take 1 tablet (25 mg total) by mouth daily.   lubiprostone  (AMITIZA ) 8 MCG capsule TAKE 1 CAPSULE (8 MCG TOTAL) BY MOUTH 2 (TWO) TIMES DAILY WITH A MEAL.   omeprazole  (PRILOSEC) 40 MG capsule TAKE 1 CAPSULE (40 MG TOTAL) BY MOUTH DAILY.   rosuvastatin  (CRESTOR ) 40 MG tablet Take 1 tablet (40 mg total) by mouth daily.   traZODone  (DESYREL ) 100 MG tablet Take 1 tablet (100 mg total) by mouth at bedtime.   VITAMIN D  PO Take by mouth daily.   No current facility-administered medications on file prior to visit.    Allergies:  Allergies  Allergen Reactions   Aspirin  Other (See Comments)    Stomach discomfort    Social History:  Social History   Socioeconomic History   Marital status: Single    Spouse name: Not on file   Number of children: Not on file   Years of education: Not on file   Highest education level: Associate degree: occupational, Scientist, product/process development, or vocational program  Occupational History   Not on file  Tobacco Use   Smoking status: Every Day    Current packs/day: 0.50    Average packs/day: 0.5 packs/day for 38.0 years (19.0 ttl pk-yrs)    Types: Cigarettes   Smokeless tobacco: Never  Vaping Use   Vaping status: Never Used  Substance and Sexual Activity   Alcohol use: No    Comment: rarely once a year wine   Drug use: No   Sexual activity: Not Currently  Other Topics Concern   Not on file  Social History Narrative   Not on file   Social Drivers of Health   Financial Resource Strain: Medium Risk (08/12/2023)   Overall Financial  Resource Strain (CARDIA)    Difficulty of Paying Living Expenses: Somewhat hard  Food Insecurity: Food Insecurity Present (08/12/2023)   Hunger Vital Sign  Worried About Programme researcher, broadcasting/film/video in the Last Year: Sometimes true    [employer] of Food in the Last Year: Sometimes true  Transportation Needs: No Transportation Needs (08/12/2023)   PRAPARE - Administrator, Civil Service (Medical): No    Lack of Transportation (Non-Medical): No  Physical Activity: Sufficiently Active (08/12/2023)   Exercise Vital Sign    Days of Exercise per Week: 3 days    Minutes of Exercise per Session: 70 min  Stress: Stress Concern Present (08/12/2023)   [employer] of Occupational Health - Occupational Stress Questionnaire    Feeling of Stress : Very much  Social Connections: Unknown (08/12/2023)   Social Connection and Isolation Panel [NHANES]    Frequency of Communication with Friends and Family: Once a week    Frequency of Social Gatherings with Friends and Family: Patient declined    Attends Religious Services: Never    Database administrator or Organizations: No    Attends Engineer, structural: Not on file    Marital Status: Divorced  Catering manager Violence: Not on file   Social History   Tobacco Use  Smoking Status Every Day   Current packs/day: 0.50   Average packs/day: 0.5 packs/day for 38.0 years (19.0 ttl pk-yrs)   Types: Cigarettes  Smokeless Tobacco Never   Social History   Substance and Sexual Activity  Alcohol Use No   Comment: rarely once a year wine    Family History:  Family History  Problem Relation Age of Onset   Cancer Mother        brain   Lung cancer Mother    Cancer Father        unsure wich kind   Colon polyps Father    Rheum arthritis Neg Hx    Colon cancer Neg Hx    Esophageal cancer Neg Hx     Past medical history, surgical history, medications, allergies, family history and social history reviewed with patient today and changes  made to appropriate areas of the chart.   Review of Systems  Constitutional:  Positive for malaise/fatigue. Negative for fever and weight loss.  HENT:  Positive for hearing loss (left ear).   Eyes: Negative.   Respiratory: Negative.    Cardiovascular: Negative.   Gastrointestinal: Negative.   Genitourinary: Negative.   Musculoskeletal:  Positive for joint pain and myalgias. Negative for falls.  Skin: Negative.   Neurological:  Positive for headaches. Negative for dizziness.  Psychiatric/Behavioral: Negative.     All other ROS negative except what is listed above and in the HPI.      Objective:     BP 110/78 (BP Location: Left Arm, Patient Position: Sitting, Cuff Size: Large)   Pulse (!) 107   Temp (!) 97.5 F (36.4 C)   Ht 5\' 5"  (1.651 m)   Wt 200 lb (90.7 kg)   SpO2 93%   BMI 33.28 kg/m   Wt Readings from Last 3 Encounters:  11/16/23 200 lb (90.7 kg)  08/15/23 206 lb 9.6 oz (93.7 kg)  04/05/23 201 lb (91.2 kg)    Physical Exam Vitals and nursing note reviewed. Exam conducted with a chaperone present.  Constitutional:      General: She is not in acute distress.    Appearance: Normal appearance.  HENT:     Head: Normocephalic and atraumatic.     Right Ear: Tympanic membrane, ear canal and external ear normal.     Left Ear: Tympanic membrane, ear  canal and external ear normal.  Eyes:     Conjunctiva/sclera: Conjunctivae normal.  Cardiovascular:     Rate and Rhythm: Normal rate and regular rhythm.     Pulses: Normal pulses.     Heart sounds: Normal heart sounds.  Pulmonary:     Effort: Pulmonary effort is normal.     Breath sounds: Normal breath sounds.  Abdominal:     Palpations: Abdomen is soft.     Tenderness: There is no abdominal tenderness.  Genitourinary:    General: Normal vulva.     Exam position: Lithotomy position.     Labia:        Right: No rash, tenderness or lesion.        Left: No rash, tenderness or lesion.      Vagina: Normal.     Cervix:  Normal.     Uterus: Normal.      Adnexa: Right adnexa normal and left adnexa normal.  Musculoskeletal:        General: Tenderness present. No swelling. Normal range of motion.     Cervical back: Normal range of motion and neck supple.     Right lower leg: No edema.     Left lower leg: No edema.  Lymphadenopathy:     Cervical: No cervical adenopathy.  Skin:    General: Skin is warm and dry.  Neurological:     General: No focal deficit present.     Mental Status: She is alert and oriented to person, place, and time.     Cranial Nerves: No cranial nerve deficit.     Coordination: Coordination normal.     Gait: Gait normal.  Psychiatric:        Mood and Affect: Mood normal.        Behavior: Behavior normal.        Thought Content: Thought content normal.        Judgment: Judgment normal.     Results for orders placed or performed in visit on 08/15/23  Hemoglobin A1c   Collection Time: 06/20/23 12:00 AM  Result Value Ref Range   Hemoglobin A1C 10.2   CBC with Differential/Platelet   Collection Time: 08/15/23  9:41 AM  Result Value Ref Range   WBC 7.4 4.0 - 10.5 K/uL   RBC 5.36 (H) 3.87 - 5.11 Mil/uL   Hemoglobin 16.5 (H) 12.0 - 15.0 g/dL   HCT 19.1 (H) 47.8 - 29.5 %   MCV 90.5 78.0 - 100.0 fl   MCHC 34.0 30.0 - 36.0 g/dL   RDW 62.1 30.8 - 65.7 %   Platelets 279.0 150.0 - 400.0 K/uL   Neutrophils Relative % 59.0 43.0 - 77.0 %   Lymphocytes Relative 30.2 12.0 - 46.0 %   Monocytes Relative 6.0 3.0 - 12.0 %   Eosinophils Relative 4.1 0.0 - 5.0 %   Basophils Relative 0.7 0.0 - 3.0 %   Neutro Abs 4.4 1.4 - 7.7 K/uL   Lymphs Abs 2.2 0.7 - 4.0 K/uL   Monocytes Absolute 0.4 0.1 - 1.0 K/uL   Eosinophils Absolute 0.3 0.0 - 0.7 K/uL   Basophils Absolute 0.0 0.0 - 0.1 K/uL  Lipid panel   Collection Time: 08/15/23  9:41 AM  Result Value Ref Range   Cholesterol 139 0 - 200 mg/dL   Triglycerides 846.9 (H) 0.0 - 149.0 mg/dL   HDL 62.95 >28.41 mg/dL   VLDL 32.4 0.0 - 40.1 mg/dL    LDL Cholesterol 47 0 - 99 mg/dL  Total CHOL/HDL Ratio 2    NonHDL 82.63   Comprehensive metabolic panel   Collection Time: 08/15/23  9:41 AM  Result Value Ref Range   Sodium 136 135 - 145 mEq/L   Potassium 4.3 3.5 - 5.1 mEq/L   Chloride 103 96 - 112 mEq/L   CO2 25 19 - 32 mEq/L   Glucose, Bld 265 (H) 70 - 99 mg/dL   BUN 13 6 - 23 mg/dL   Creatinine, Ser 6.04 0.40 - 1.20 mg/dL   Total Bilirubin 0.5 0.2 - 1.2 mg/dL   Alkaline Phosphatase 67 39 - 117 U/L   AST 18 0 - 37 U/L   ALT 23 0 - 35 U/L   Total Protein 6.7 6.0 - 8.3 g/dL   Albumin 4.3 3.5 - 5.2 g/dL   GFR 54.09 >81.19 mL/min   Calcium  9.4 8.4 - 10.5 mg/dL  Microalbumin / creatinine urine ratio   Collection Time: 08/15/23  9:41 AM  Result Value Ref Range   Microalb, Ur 2.8 (H) 0.0 - 1.9 mg/dL   Creatinine,U 147.8 mg/dL   Microalb Creat Ratio 24.3 0.0 - 30.0 mg/g  VITAMIN D  25 Hydroxy (Vit-D Deficiency, Fractures)   Collection Time: 08/15/23  9:41 AM  Result Value Ref Range   VITD 34.76 30.00 - 100.00 ng/mL      Assessment & Plan:   Problem List Items Addressed This Visit       Cardiovascular and Mediastinum   Aortic atherosclerosis (HCC)   Noted on CT scan 08/29/20. Continue rosuvastatin  40mg  daily. Check lipid panel today.         Digestive   Chronic hepatitis C (HCC) (Chronic)   She has a history of hepatitis C and she was treated.  She states that she has been undetectable for the past 10 to 15 years.  She did have a ultrasound with elastography which showed possible early cirrhosis.  Will discuss this further next visit and possible rechecking an ultrasound.        Endocrine   Type 2 diabetes mellitus with diabetic neuropathy, with long-term current use of insulin  (HCC)   Chronic, not controlled. Glipizide  XL 10 mg daily and jardiance  10mg  daily. Continue the current regimen and collaboration with endocrinology. Encourage regular exercise and a healthy diet. Check CMP, CBC, and A1c today. Eye exam up to  date. Declines pneumonia vaccine.       Relevant Orders   CBC with Differential/Platelet   Comprehensive metabolic panel with GFR   Hemoglobin A1c     Other   Vitamin D  deficiency   Check vitamin D  today and treat based on results.       Relevant Orders   VITAMIN D  25 Hydroxy (Vit-D Deficiency, Fractures)   Anxiety   Chronic, ongoing. Continue cymbalta  30mg  daily.       Long term current use of oral hypoglycemic drug   Continue glipizide  xl 10mg  daily       Primary insomnia   Chronic, stable. Continue trazodone  100mg  at bedtime as needed.       Mixed hyperlipidemia   Chronic, stable. Continue Fenofibrate  145mg  daily and rosuvastatin  40mg  daily.      Relevant Orders   CBC with Differential/Platelet   Comprehensive metabolic panel with GFR   Lipid panel   Multiple joint pain   She has been experiencing multiple joint and myalgias for the last 1-2 months. May be a side effect of jardiance . Stop this for 2 weeks and see if symptoms improve. Check  ANA, RF, ESR, CRP, CK, and lyme titers today.       Relevant Orders   Sedimentation rate   C-reactive protein   ANA w/Reflex   Rheumatoid Factor   B. burgdorfi antibodies   CK (Creatine Kinase)   Other Visit Diagnoses       Routine general medical examination at a health care facility    -  Primary     Encounter for screening mammogram for malignant neoplasm of breast       Relevant Orders   MM 3D SCREENING MAMMOGRAM BILATERAL BREAST     Postmenopausal estrogen deficiency       Relevant Orders   DG Bone Density     Screening for cervical cancer       Pap with HPV done today   Relevant Orders   Cytology - PAP        Follow up plan: Return in about 3 months (around 02/16/2024) for Diabetes.   LABORATORY TESTING:  - Pap smear: pap done  IMMUNIZATIONS:   - Tdap: Tetanus vaccination status reviewed: last tetanus booster within 10 years. - Influenza: Postponed to flu season - Pneumovax: Not applicable - Prevnar:  Declined - HPV: Not applicable - Shingrix vaccine: Declined  SCREENING: -Mammogram: Ordered today  - Colonoscopy: Up to date  - Bone Density: Ordered today   PATIENT COUNSELING:   Advised to take 1 mg of folate supplement per day if capable of pregnancy.   Sexuality: Discussed sexually transmitted diseases, partner selection, use of condoms, avoidance of unintended pregnancy  and contraceptive alternatives.   Advised to avoid cigarette smoking.  I discussed with the patient that most people either abstain from alcohol or drink within safe limits (<=14/week and <=4 drinks/occasion for males, <=7/weeks and <= 3 drinks/occasion for females) and that the risk for alcohol disorders and other health effects rises proportionally with the number of drinks per week and how often a drinker exceeds daily limits.  Discussed cessation/primary prevention of drug use and availability of treatment for abuse.   Diet: Encouraged to adjust caloric intake to maintain  or achieve ideal body weight, to reduce intake of dietary saturated fat and total fat, to limit sodium intake by avoiding high sodium foods and not adding table salt, and to maintain adequate dietary potassium and calcium  preferably from fresh fruits, vegetables, and low-fat dairy products.    stressed the importance of regular exercise  Injury prevention: Discussed safety belts, safety helmets, smoke detector, smoking near bedding or upholstery.   Dental health: Discussed importance of regular tooth brushing, flossing, and dental visits.    NEXT PREVENTATIVE PHYSICAL DUE IN 1 YEAR. Return in about 3 months (around 02/16/2024) for Diabetes.  Allah Reason A Ellana Kawa

## 2023-11-16 NOTE — Assessment & Plan Note (Signed)
Noted on CT scan 08/29/20. Continue rosuvastatin '40mg'$  daily. Check lipid panel today.

## 2023-11-16 NOTE — Assessment & Plan Note (Addendum)
 Chronic, ongoing. Continue cymbalta  30mg  daily.

## 2023-11-16 NOTE — Assessment & Plan Note (Signed)
 Chronic, stable. Continue trazodone  100mg  at bedtime as needed.

## 2023-11-16 NOTE — Assessment & Plan Note (Signed)
She has a history of hepatitis C and she was treated.  She states that she has been undetectable for the past 10 to 15 years.  She did have a ultrasound with elastography which showed possible early cirrhosis.  Will discuss this further next visit and possible rechecking an ultrasound.

## 2023-11-16 NOTE — Patient Instructions (Signed)
 It was great to see you!  We are checking your labs today and will let you know the results via mychart/phone.   Start using voltaren gel on your thumb along with ice  Stop jardiance  for 2 weeks and see if the joint/muscle pain gets better  Let's follow-up in 3 months, sooner if you have concerns.  If a referral was placed today, you will be contacted for an appointment. Please note that routine referrals can sometimes take up to 3-4 weeks to process. Please call our office if you haven't heard anything after this time frame.  Take care,  Rheba Cedar, NP

## 2023-11-16 NOTE — Assessment & Plan Note (Signed)
Continue glipizide xl 10mg  daily

## 2023-11-16 NOTE — Assessment & Plan Note (Signed)
Check vitamin D today and treat based on results.  °

## 2023-11-17 ENCOUNTER — Other Ambulatory Visit: Payer: Self-pay | Admitting: Nurse Practitioner

## 2023-11-17 ENCOUNTER — Encounter: Payer: Self-pay | Admitting: Nurse Practitioner

## 2023-11-17 DIAGNOSIS — F419 Anxiety disorder, unspecified: Secondary | ICD-10-CM

## 2023-11-17 DIAGNOSIS — Z7984 Long term (current) use of oral hypoglycemic drugs: Secondary | ICD-10-CM

## 2023-11-17 DIAGNOSIS — B182 Chronic viral hepatitis C: Secondary | ICD-10-CM

## 2023-11-17 DIAGNOSIS — E782 Mixed hyperlipidemia: Secondary | ICD-10-CM

## 2023-11-17 DIAGNOSIS — Z124 Encounter for screening for malignant neoplasm of cervix: Secondary | ICD-10-CM

## 2023-11-17 DIAGNOSIS — Z780 Asymptomatic menopausal state: Secondary | ICD-10-CM

## 2023-11-17 DIAGNOSIS — E559 Vitamin D deficiency, unspecified: Secondary | ICD-10-CM

## 2023-11-17 DIAGNOSIS — N6320 Unspecified lump in the left breast, unspecified quadrant: Secondary | ICD-10-CM

## 2023-11-17 DIAGNOSIS — M2550 Pain in unspecified joint: Secondary | ICD-10-CM

## 2023-11-17 DIAGNOSIS — I700 Atherosclerosis of aorta: Secondary | ICD-10-CM

## 2023-11-17 DIAGNOSIS — F5101 Primary insomnia: Secondary | ICD-10-CM

## 2023-11-17 DIAGNOSIS — Z0001 Encounter for general adult medical examination with abnormal findings: Secondary | ICD-10-CM

## 2023-11-17 DIAGNOSIS — E1140 Type 2 diabetes mellitus with diabetic neuropathy, unspecified: Secondary | ICD-10-CM

## 2023-11-17 LAB — CYTOLOGY - PAP
Adequacy: ABSENT
Comment: NEGATIVE
Diagnosis: NEGATIVE
High risk HPV: NEGATIVE

## 2023-11-17 LAB — ANA W/REFLEX: Anti Nuclear Antibody (ANA): NEGATIVE

## 2023-11-17 LAB — RHEUMATOID FACTOR: Rheumatoid fact SerPl-aCnc: <10 [IU]/mL (ref <14)

## 2023-11-17 LAB — B. BURGDORFI ANTIBODIES: B burgdorferi Ab IgG+IgM: <0.9 {index}

## 2023-11-17 NOTE — Progress Notes (Signed)
 [facility] called to ask the pt time be changed to 0900 as they do not start until 9:00. Appt time changed in Appt Desk. Pt called to make aware. Pt opted to be RS due to work.

## 2023-11-18 ENCOUNTER — Inpatient Hospital Stay
Admission: RE | Admit: 2023-11-18 | Discharge: 2023-11-18 | Disposition: A | Discharge status: Home / Self Care | Source: Ambulatory Visit

## 2023-11-18 ENCOUNTER — Other Ambulatory Visit: Payer: Self-pay | Admitting: Nurse Practitioner

## 2023-11-18 NOTE — Telephone Encounter (Signed)
 Requesting: TRAZODONE  100 MG TABLET  Last Visit: 11/16/2023 Next Visit: 02/16/2024 Last Refill: 08/15/2023  Please Advise

## 2023-11-21 ENCOUNTER — Other Ambulatory Visit: Payer: Self-pay | Admitting: Nurse Practitioner

## 2023-11-21 MED ORDER — STEGLATRO 5 MG PO TABS: 5.0000 mg | ORAL_TABLET | Freq: Every day | ORAL | 1 refills | Status: DC

## 2023-11-23 ENCOUNTER — Other Ambulatory Visit: Payer: Self-pay | Admitting: Nurse Practitioner

## 2023-11-23 ENCOUNTER — Inpatient Hospital Stay: Admission: RE | Admit: 2023-11-23 | Source: Ambulatory Visit

## 2023-11-23 MED ORDER — DAPAGLIFLOZIN PROPANEDIOL 5 MG PO TABS: 5.0000 mg | ORAL_TABLET | Freq: Every day | ORAL | 1 refills | Status: DC

## 2023-11-29 ENCOUNTER — Other Ambulatory Visit: Payer: Self-pay | Admitting: Nurse Practitioner

## 2023-11-29 NOTE — Telephone Encounter (Signed)
 Requesting: GLIPIZIDE  ER 10 MG TABLET  Last Visit: 11/16/2023 Next Visit: 02/16/2024 Last Refill: 08/15/2023  Please Advise

## 2023-11-30 ENCOUNTER — Encounter

## 2023-12-01 ENCOUNTER — Telehealth: Payer: Self-pay

## 2023-12-01 NOTE — Progress Notes (Unsigned)
 Complex Care Management Note Care Guide Note  12/01/2023 Name: Sara Banks MRN: [id number] DOB: [birth year]   Complex Care Management Outreach Attempts: An unsuccessful telephone outreach was attempted today to offer the patient information about available complex care management services.  Follow Up Plan:  Additional outreach attempts will be made to offer the patient complex care management information and services.   Encounter Outcome:  No Answer  [facility]  [facility], [facility] Health Care Management Assistant Direct Dial: [phone]  Fax: [phone]

## 2023-12-05 NOTE — Progress Notes (Signed)
 Complex Care Management Note  Care Guide Note 12/05/2023 Name: Sara Banks MRN: [id number] DOB: [birth year]  Sara Banks is a 63 y.o. year old female who sees McElwee, Lauren A, NP for primary care. I reached out to Sara JONETTA Lowers by phone today to offer complex care management services.  Sara Banks was given information about Complex Care Management services today including:   The Complex Care Management services include support from the care team which includes your Nurse Care Manager, Clinical Social Worker, or Pharmacist.  The Complex Care Management team is here to help remove barriers to the health concerns and goals most important to you. Complex Care Management services are voluntary, and the patient may decline or stop services at any time by request to their care team member.   Complex Care Management Consent Status: Patient agreed to services and verbal consent obtained.   Follow up plan:  Telephone appointment with complex care management team member scheduled for:  12/08/23 & 12/09/23.   Encounter Outcome:  Patient Scheduled  [facility]  [facility], [facility] Health Care Management Assistant Direct Dial: [phone]  Fax: [phone]

## 2023-12-06 ENCOUNTER — Other Ambulatory Visit: Payer: Self-pay | Admitting: Nurse Practitioner

## 2023-12-08 ENCOUNTER — Other Ambulatory Visit: Payer: Self-pay

## 2023-12-08 DIAGNOSIS — E1140 Type 2 diabetes mellitus with diabetic neuropathy, unspecified: Secondary | ICD-10-CM

## 2023-12-08 NOTE — Patient Outreach (Signed)
 Complex Care Management   Visit Note  12/08/2023  Name:  Sara Banks MRN: [id number] DOB: Jul 05, 1960  Situation: Referral received for Complex Care Management related to Diabetes with Complications I obtained verbal consent from Patient.  Visit completed with patient  on the phone  Background:   Past Medical History:  Diagnosis Date   Acute ischemic stroke of right pons 05/11/2013   Anxiety    Arthritis    Chronic pain    Depression    Diabetes mellitus without complication (HCC)    GERD (gastroesophageal reflux disease)    Hepatitis C dx 2014   [facility]   Neuropathy    Stroke (cerebrum) (HCC)     Assessment: Patient Reported Symptoms:  Cognitive Cognitive Status: Alert and oriented to person, place, and time      Neurological Neurological Review of Symptoms: Numbness (bilateral diabetic neuropathy of feet) Neurological Management Strategies: Medication therapy, Routine screening  HEENT HEENT Symptoms Reported: Other: Other HEENT Symptoms/Conditions: constant chronic cough for years. possible due to reflex will schedule appointment with GI HEENT Management Strategies: Routine screening    Cardiovascular Cardiovascular Symptoms Reported: No symptoms reported Does patient have uncontrolled Hypertension?: No Cardiovascular Conditions: High blood cholesterol Cardiovascular Management Strategies: Medication therapy, Routine screening  Respiratory Respiratory Symptoms Reported: Dry cough Additional Respiratory Details: cough possibly related to reflex, patient to schedule with GI    Endocrine Patient reports the following symptoms related to hypoglycemia or hyperglycemia : No symptoms reported Is patient diabetic?: Yes Is patient checking blood sugars at home?: Yes Endocrine Conditions: Diabetes Endocrine Management Strategies: Medication therapy, Coping strategies, Diet modification, Fluid modification, Routine screening  Gastrointestinal Gastrointestinal  Symptoms Reported: Constipation Additional Gastrointestinal Details: manange with OTC stool softeners and diet changes Gastrointestinal Conditions: Constipation Gastrointestinal Management Strategies: Fluid modification, Diet modification, Medication therapy    Genitourinary Genitourinary Symptoms Reported: No symptoms reported    Integumentary Integumentary Symptoms Reported: No symptoms reported    Musculoskeletal Musculoskelatal Symptoms Reviewed: Difficulty walking Additional Musculoskeletal Details: left knee injury and previous surgery Musculoskeletal Conditions: Joint pain, Unsteady gait Musculoskeletal Management Strategies: Exercise, Routine screening, Medication therapy Falls in the past year?: Yes Number of falls in past year: 1 or less Was there an injury with Fall?: No Fall Risk Category Calculator: 1 Patient Fall Risk Level: Low Fall Risk Patient at Risk for Falls Due to: History of fall(s)  Psychosocial Psychosocial Symptoms Reported: No symptoms reported     Quality of Family Relationships: helpful, supportive Do you feel physically threatened by others?: No      11/16/2023    8:30 AM  Depression screen PHQ 2/9  Decreased Interest 3  Down, Depressed, Hopeless 0  PHQ - 2 Score 3  Altered sleeping 3  Tired, decreased energy 3  Change in appetite 1  Feeling bad or failure about yourself  0  Trouble concentrating 3  Moving slowly or fidgety/restless 0  Suicidal thoughts 0  PHQ-9 Score 13  Difficult doing work/chores Extremely dIfficult    There were no vitals filed for this visit.  Medications Reviewed Today     Reviewed by Lonzell Planas, RN (Registered Nurse) on 12/08/23 at 0935  Med List Status: <None>   Medication Order Taking? Sig Documenting Provider Last Dose Status Informant  Continuous Glucose Receiver [facility] G7 RECEIVER) DEVI [id number] Yes 1 each by Does not apply route continuous. Nedra Tinnie LABOR, NP  Active   Continuous Glucose Sensor (DEXCOM  G7 SENSOR) MISC [id number] Yes 1 each by  miscellaneous route. [provider]  Active   Continuous Glucose Sensor (DEXCOM G7 SENSOR) MISC [id number] Yes CHANGE SENSOR EVERY 10 DAYS McElwee, Lauren A, NP  Active   Continuous Glucose Sensor (DEXCOM G7 SENSOR) MISC [id number] Yes CHANGE SENSOR EVERY 10 DAYS McElwee, Lauren A, NP  Active   dapagliflozin  propanediol (FARXIGA ) 5 MG TABS tablet [id number] Yes Take 1 tablet (5 mg total) by mouth daily before breakfast. McElwee, Lauren A, NP  Active   DULoxetine  (CYMBALTA ) 30 MG capsule [id number] Yes Take 1 capsule (30 mg total) by mouth daily. Nedra Tinnie LABOR, NP  Active   fenofibrate  (TRICOR ) 145 MG tablet [id number] Yes TAKE 1 TABLET BY MOUTH EVERY DAY McElwee, Lauren A, NP  Active   gabapentin  (NEURONTIN ) 300 MG capsule [id number] Yes TAKE 1 CAPSULE BY MOUTH THREE TIMES A DAY McElwee, Lauren A, NP  Active   glipiZIDE  (GLUCOTROL  XL) 10 MG 24 hr tablet [id number] Yes TAKE 1 TABLET BY MOUTH EVERY DAY McElwee, Lauren A, NP  Active   insulin  aspart (NOVOLOG ) 100 UNIT/ML injection [id number] Yes Inject 4 Units into the skin daily. Take once a day with your biggest meal McElwee, Lauren A, NP  Active   insulin  degludec (TRESIBA  FLEXTOUCH) 100 UNIT/ML FlexTouch Pen [id number] Yes Inject 30 Units into the skin daily. McElwee, Lauren A, NP  Active   Insulin  Pen Needle (PEN NEEDLES) 32G X 5 MM MISC [id number] Yes 1 Needle by Does not apply route daily. Webb, Padonda B, FNP  Active   losartan  (COZAAR ) 25 MG tablet [id number] Yes Take 1 tablet (25 mg total) by mouth daily. McElwee, Lauren A, NP  Active   lubiprostone  (AMITIZA ) 8 MCG capsule [id number]  TAKE 1 CAPSULE (8 MCG TOTAL) BY MOUTH 2 (TWO) TIMES DAILY WITH A MEAL.  Patient not taking: Reported on 12/08/2023   Nedra Tinnie LABOR, NP  Active   omeprazole  (PRILOSEC) 40 MG capsule [id number] Yes TAKE 1 CAPSULE (40 MG TOTAL) BY MOUTH DAILY. Kerman Vina HERO, NP  Active   rosuvastatin  (CRESTOR ) 40 MG tablet [id number]  Yes Take 1 tablet (40 mg total) by mouth daily. McElwee, Lauren A, NP  Active   traZODone  (DESYREL ) 100 MG tablet [id number] Yes TAKE 1 TABLET BY MOUTH EVERYDAY AT BEDTIME McElwee, Lauren A, NP  Active   VITAMIN D  PO [id number] Yes Take by mouth daily. [provider]  Active             Recommendation:   Continue Current Plan of Care  Follow Up Plan:   Telephone follow up appointment date/time:  12/22/23 at 9:00   Olam Idol BSN RN CCM [place]  [facility], [facility] RN Care Manager Direct Dial: [phone] Fax: [phone]

## 2023-12-08 NOTE — Patient Instructions (Signed)
 Visit Information  Thank you for taking time to visit with me today. Please don't hesitate to contact me if I can be of assistance to you before our next scheduled appointment.  Our next appointment is by telephone on 12/22/23 at 9:00 Please call the care guide team at [phone] if you need to cancel or reschedule your appointment.   Following is a copy of your care plan:   Goals Addressed             This Visit's Progress    VBCI RN Care Plan       Problems:  Chronic Disease Management support and education needs related to DMII  Goal: Over the next 30 days the Patient will attend all scheduled medical appointments: 7/7 dexa scan, 7/8 mammogram,  6/27 clinical pharmacist,  as evidenced by chart review and patient report        continue to work with RN Care Manager and/or Social Worker to address care management and care coordination needs related to DMII as evidenced by adherence to care management team scheduled appointments     take all medications exactly as prescribed and will call provider for medication related questions as evidenced by chart review and patient report     Interventions:   Diabetes Interventions: Assessed patient's understanding of A1c goal: <7% Provided education to patient about basic DM disease process Reviewed medications with patient and discussed importance of medication adherence Counseled on importance of regular laboratory monitoring as prescribed Discussed plans with patient for ongoing care management follow up and provided patient with direct contact information for care management team Review of patient status, including review of consultants reports, relevant laboratory and other test results, and medications completed Lab Results  Component Value Date   HGBA1C 9.5 (H) 11/16/2023    Patient Self-Care Activities:  Attend all scheduled provider appointments Call pharmacy for medication refills 3-7 days in advance of running out of  medications Call provider office for new concerns or questions  Take medications as prescribed   Work with the pharmacist to address medication management needs and will continue to work with the clinical team to address health care and disease management related needs check blood sugar at prescribed times: once daily check feet daily for cuts, sores or redness switch to sugar-free drinks  Plan:  Telephone follow up appointment with care management team member scheduled for:  12/22/23 at 9:00             Please call the Suicide and Crisis Lifeline: 988 call the [place]  National Suicide Prevention Lifeline: 918-270-5650 or TTY: 807-400-9816 TTY 404-232-7413) to talk to a trained counselor call 1-800-273-TALK (toll free, 24 hour hotline) if you are experiencing a Mental Health or Behavioral Health Crisis or need someone to talk to.  Patient verbalizes understanding of instructions and care plan provided today and agrees to view in MyChart. Active MyChart status and patient understanding of how to access instructions and care plan via MyChart confirmed with patient.      Olam Idol BSN RN CCM [place]  [facility], [facility] RN Care Manager Direct Dial: [phone] Fax: [phone]

## 2023-12-09 ENCOUNTER — Other Ambulatory Visit: Payer: Self-pay

## 2023-12-12 NOTE — Progress Notes (Signed)
 12/12/2023 Name: Sara Banks MRN: [id number] DOB: 1960-06-27  Chief Complaint  Patient presents with   Diabetes Management Plan   Sara Banks is a 63 y.o. year old female who presented for a telephone visit.   They were referred to the pharmacist by their PCP for assistance in managing diabetes.   Subjective:  Care Team: Primary Care Provider: Nedra Tinnie LABOR, NP ; Next Scheduled Visit: 02/16/24  Medication Access/Adherence  Current Pharmacy:  [id number] - [place] - [address] [place] [address] Phone: [phone] Fax: [phone]  [id number] - [place] - [address] [place] [address] Phone: [phone] Fax: [phone]  -Patient reports affordability concerns with their medications: Yes  -Patient reports access/transportation concerns to their pharmacy: No  -Patient reports adherence concerns with their medications:  Yes    Diabetes: Current medications: Tresiba  12-20 units daily, Novolog  4 units daily, Farxiga  5mg  daily, glipizide  XL 10mg  daily  -Medications tried in the past: Ozempic  caused stomach upset and patient states Trulicity  did not work well but does not recall any adverse side effects from mediation. Jardiance  caused muscle and joint pain. -Using Dexcom G7 for CGM and states FBG >200 recently -Patient reports feeling shaky if BG <150 -States she is currently paying $30 monthly for Dexcom sensors and Farxiga ; at least $60 foTresiba  -Tresiba  sliding scale: BG 151-200 injects 12 units, BG 201-250 injects 14 units, 251-300 injects 16 units, 301-350 18 units, >350 injects 18 units -Patient is only using Novolog  once daily due to typically only having 1 actual meal per day due to work schedule (on call) -Patient is [facility] by [facility] with [facility]  Hypertension: Current medications: losartan  25mg  every other day -Losartan  25mg  prescribed  to take once daily, but patient states she takes this every other day -Does not monitor home BP regularly -BP at 6/4 OV was 110/78  Hyperlipidemia/ASCVD Risk Reduction Current lipid lowering medications: rosuvastatin  40mg  daily, fenofibrate  145mg  daily Antiplatelet regimen: none- ASA causes GI upset  Difficulty Staying Asleep -Currently taking trazodone  100mg  nightly for sleep -Patient states mediation does help her fall asleep, but she typially wakes up 3-4 hours after taking and is unable to go back to sleep  Objective:  Lab Results  Component Value Date   HGBA1C 9.5 (H) 11/16/2023   Lab Results  Component Value Date   CREATININE 1.02 11/16/2023   BUN 21 11/16/2023   NA 136 11/16/2023   K 3.9 11/16/2023   CL 103 11/16/2023   CO2 24 11/16/2023   Lab Results  Component Value Date   CHOL 158 11/16/2023   HDL 43.20 11/16/2023   LDLCALC 47 08/15/2023   LDLDIRECT 54.0 11/16/2023   TRIG (H) 11/16/2023    522.0 Triglyceride is over 400; calculations on Lipids are invalid.   CHOLHDL 4 11/16/2023   Medications Reviewed Today     Reviewed by Deanna Channing LABOR, [facility] (Pharmacist) on 12/12/23 at 1646  Med List Status: <None>   Medication Order Taking? Sig Documenting Provider Last Dose Status Informant    Discontinued 12/09/23 0857     Discontinued 12/09/23 0858     Discontinued 12/09/23 0858   Continuous Glucose Sensor (DEXCOM G7 SENSOR) MISC [id number] Yes CHANGE SENSOR EVERY 10 DAYS McElwee, Lauren A, NP  Active   dapagliflozin  propanediol (FARXIGA ) 5 MG TABS tablet [id number] Yes Take 1 tablet (5 mg total) by mouth daily before breakfast. McElwee, Tinnie LABOR, NP  Active   DULoxetine  (CYMBALTA ) 30 MG capsule [id number] Yes Take 1 capsule (30 mg total) by mouth daily. Nedra Tinnie LABOR, NP  Active   fenofibrate  (TRICOR ) 145 MG tablet [id number] Yes TAKE 1 TABLET BY MOUTH EVERY DAY McElwee, Lauren A, NP  Active   gabapentin  (NEURONTIN ) 300 MG capsule [id number] Yes TAKE 1 CAPSULE BY  MOUTH THREE TIMES A DAY McElwee, Lauren A, NP  Active   glipiZIDE  (GLUCOTROL  XL) 10 MG 24 hr tablet [id number] Yes TAKE 1 TABLET BY MOUTH EVERY DAY McElwee, Lauren A, NP  Active   ibuprofen  (ADVIL ) 600 MG tablet [id number] Yes Take 600 mg by mouth every 6 (six) hours as needed. [provider]  Active   insulin  aspart (NOVOLOG ) 100 UNIT/ML injection [id number] Yes Inject 4 Units into the skin daily. Take once a day with your biggest meal McElwee, Lauren A, NP  Active   insulin  degludec (TRESIBA  FLEXTOUCH) 100 UNIT/ML FlexTouch Pen [id number] Yes Inject 30 Units into the skin daily.  Patient taking differently: Inject 12-20 Units into the skin daily.   McElwee, Lauren A, NP  Active   Insulin  Pen Needle (PEN NEEDLES) 32G X 5 MM MISC [id number] Yes 1 Needle by Does not apply route daily. Webb, Padonda B, FNP  Active   losartan  (COZAAR ) 25 MG tablet [id number] Yes Take 1 tablet (25 mg total) by mouth daily.  Patient taking differently: Take 25 mg by mouth every other day.   Nedra Tinnie LABOR, NP  Active    Patient not taking:   Discontinued 12/09/23 0910 (Cost of medication)   omeprazole  (PRILOSEC) 40 MG capsule [id number] Yes TAKE 1 CAPSULE (40 MG TOTAL) BY MOUTH DAILY. Kerman Vina HERO, NP  Active   rosuvastatin  (CRESTOR ) 40 MG tablet [id number] Yes Take 1 tablet (40 mg total) by mouth daily. McElwee, Lauren A, NP  Active   traZODone  (DESYREL ) 100 MG tablet [id number] Yes TAKE 1 TABLET BY MOUTH EVERYDAY AT BEDTIME McElwee, Lauren A, NP  Active   VITAMIN D  PO [id number] Yes Take by mouth daily. [provider]  Active            Assessment/Plan:   Diabetes: -Currently uncontrolled -Obtaining Farxiga  copay card that should make monthly copay $5 -ObtaininTresiba  copay card that should make monthly copay $35 -Recommend continuing Farxiga  5mg  daily, glipizide  XL 10mg  daily -Could consider Tresiba  20 units daily, increasing by 2 units every 4-5 days until FBG consistently <150 based  on recent BG readings and A1c -I also recommend retrial of GLP1, Trulicity  or Mounjaro if covered by insurance  Hypertension: -Currently controlled -Recommend consideration of changing medication to lisinopril 10mg  daily based on current BP control with patient taking losartan  25mg  every other day -Recommend monitoring/recording home BP at least 3x/week, especially if BP medication is changed  Hyperlipidemia/ASCVD Risk Reduction: -Currently controlled based on LDL<70; however, TG is quite elevated -Does not appear that fenofibrate  160mg  daily has been filled since a 90 day supply on 1/23- I will verify adherence to medication  Difficulty Staying Asleep -Could consider taking one-half trazodone  100mg  tablet upon waking during the night as long as patient has at least another 4 hours left to sleep to avoid daytime drowsiness  Follow Up Plan: Tuesday, July 1st to discuss copay cards for Farxiga  and Tresiba  (will send via MyChart message, so patient can provide to pharmacy when refill is due), Trazodone , and Fenofibrate   Channing LABOR [place], PharmD, DPLA

## 2023-12-13 ENCOUNTER — Telehealth: Payer: Self-pay

## 2023-12-13 ENCOUNTER — Other Ambulatory Visit: Payer: Self-pay

## 2023-12-13 ENCOUNTER — Other Ambulatory Visit: Payer: Self-pay | Admitting: Nurse Practitioner

## 2023-12-13 MED ORDER — DEXCOM G7 SENSOR MISC: 3 refills | Status: DC

## 2023-12-13 NOTE — Progress Notes (Signed)
   12/13/2023  Patient ID: Renna JONETTA Lowers, female   DOB: 1960-12-19, 63 y.o.   MRN: [id number]  Diabetes  -PCP in agreement with GLP1 retrial with [facility] -Contacted insurance, and Mounjaro is covered on plan- 1 or 3 month supply with insurance and manufacturer copay card should be $25 copay  Hypertriglyceridemia -Provider prefers increasing Fenofibrate  versus adding new agent at this time [employer] and fenofibrate  200mg  capsuples would be $30 for a 3 month supply  Sleep maintenance insomnia  -PCP is in agreement with patient taking one-half of 100mg  trazodone  if waking up in the middle of the night with at least another 4 hours available to sleep  Sending MyChart message with this information to see if patient is agreeable to trying Mounjaro for diabetes control as well as potential weight loss and cardiorenal protection.  Goal to decrease/stop need for insulin  use.  Channing DELENA Mealing, PharmD, DPLA

## 2023-12-13 NOTE — Progress Notes (Signed)
   12/13/2023  Patient ID: Renna JONETTA Lowers, female   DOB: Nov 25, 1960, 63 y.o.   MRN: [id number]  Subjective/Objective Patient outreach to follow-up on management of chronic conditions  Diabetes: Current medications: Tresiba  10 units in the morning, Novolog  12-20 units daily with largest meal, Farxiga  5mg  daily, glipizide  XL 10mg  daily  -Medications tried in the past: Ozempic  caused stomach upset and patient states Trulicity  did not work well but does not recall any adverse side effects from mediation. Jardiance  caused muscle and joint pain. -Using Dexcom G7 for CGM and states FBG this morning 150 -Patient reports feeling shaky if BG <150 -Patient stating she is using 10 units of Tresiba  every morning and it is the Novolog  she is using once daily with her largest meal based on sliding scale  -Novolog  sliding scale: BG 151-200 injects 12 units, BG 201-250 injects 14 units, 251-300 injects 16 units, 301-350 18 units, >350 injects 18 units -Patient is only using Novolog  once daily due to typically only having 1 actual meal per day due to work schedule (on call) -Patient previously followed by endocrinology with [facility] but canceled recent follow-up and has not rescheduled- endorses specialist visits are expensive for her   Hypertension: Current medications: losartan  25mg  daily -Patient has been taking losartan  25mg  daily, not every other day -Does not monitor home BP regularly -BP at 6/4 OV was 110/78   Hyperlipidemia/ASCVD Risk Reduction Current lipid lowering medications: rosuvastatin  40mg  daily, fenofibrate  145mg  daily -Verified adherence to fenofibrate  145mg  daily Antiplatelet regimen: none- ASA causes GI upset   Difficulty Staying Asleep -Currently taking trazodone  100mg  nightly for sleep -Patient states mediation does help her fall asleep, but she typially wakes up 3-4 hours after taking and is unable to go back to sleep  Assessment/Plan  Diabetes: -Currently  uncontrolled -Providing Farxiga  copay card information via Mychart message to give to pharmacy at next refill.  This should make monthly copay $5. -ProvidinTresiba  copay card information via MyChart message to give to pharmacy at next refill.  This should make monthly copay $35 -Recommend continuing Farxiga  5mg  daily, glipizide  XL 10mg  daily -Could consider increasing Tresiba  by 2 units every 4-5 days until FBG consistently <130  -I also recommend retrial of GLP1, Trulicity  or Mounjaro if covered by insurance -Recommendations sent to PCP for consideration   Hypertension: -Currently controlled -Continue losartan  25mg  daily at this time -Recommend monitoring/recording home BP at least 3x/week   Hyperlipidemia/ASCVD Risk Reduction: -Currently controlled based on LDL<70; however, TG is quite elevated -Consider increasing fenofibrate  to 200mg  capsule daily and/or initiating Lovaza generic 1g BID   Difficulty Staying Asleep -Could consider taking one-half trazodone  100mg  tablet upon waking during the night as long as patient has at least another 4 hours left to sleep to avoid daytime drowsiness -Recommendation sent to PCP for consideration   Follow Up Plan: Will follow-up with patient to discuss any changes to diabetes or hyperlipidemia regimen and schedule follow-up appropriately   Channing DELENA Mealing, PharmD, DPLA

## 2023-12-13 NOTE — Addendum Note (Signed)
 Addended by: DEANNA ROSELLA A on: 12/13/2023 01:24 PM   Modules accepted: Orders

## 2023-12-13 NOTE — Progress Notes (Signed)
   12/13/2023  Patient ID: Renna JONETTA Lowers, female   DOB: 1961-04-23, 63 y.o.   MRN: [id number]  Patient agreeable to increasing fenofibrate  to 200mg  daily for hypertriglyceridemia and adding Mounjaro 2.5mg  weekly for diabetes control.  Orders pending for PCP to sign, and I will check in with patient again in another 4 weeks.  Channing DELENA Mealing, PharmD, DPLA

## 2023-12-14 MED ORDER — FENOFIBRATE 200 MG PO CAPS: 200.0000 mg | ORAL_CAPSULE | Freq: Every day | ORAL | 1 refills | Status: DC

## 2023-12-14 MED ORDER — TIRZEPATIDE 2.5 MG/0.5ML ~~LOC~~ SOAJ: 2.5000 mg | SUBCUTANEOUS | 1 refills | Status: DC

## 2023-12-19 ENCOUNTER — Ambulatory Visit (INDEPENDENT_AMBULATORY_CARE_PROVIDER_SITE_OTHER)
Admission: RE | Admit: 2023-12-19 | Discharge: 2023-12-19 | Disposition: A | Discharge status: Home / Self Care | Source: Ambulatory Visit | Attending: Nurse Practitioner | Admitting: Nurse Practitioner

## 2023-12-19 DIAGNOSIS — Z780 Asymptomatic menopausal state: Secondary | ICD-10-CM | POA: Diagnosis not present

## 2023-12-20 ENCOUNTER — Ambulatory Visit
Admission: RE | Admit: 2023-12-20 | Discharge: 2023-12-20 | Disposition: A | Discharge status: Home / Self Care | Source: Ambulatory Visit | Attending: Nurse Practitioner | Admitting: Nurse Practitioner

## 2023-12-20 ENCOUNTER — Ambulatory Visit: Payer: Self-pay | Admitting: Nurse Practitioner

## 2023-12-20 DIAGNOSIS — N6320 Unspecified lump in the left breast, unspecified quadrant: Secondary | ICD-10-CM

## 2023-12-22 ENCOUNTER — Telehealth: Payer: Self-pay

## 2023-12-23 MED ORDER — OMEPRAZOLE 40 MG PO CPDR: 40.0000 mg | DELAYED_RELEASE_CAPSULE | Freq: Every day | ORAL | 1 refills | Status: DC

## 2023-12-23 NOTE — Telephone Encounter (Signed)
 Requesting: Omeprazole  40mg  Last Visit: 11/16/2023 Next Visit: 02/16/2024 Last Refill: 04/11/2023 by Vina Dasen, NP  Please Advise

## 2023-12-31 ENCOUNTER — Other Ambulatory Visit: Payer: Self-pay | Admitting: Nurse Practitioner

## 2023-12-31 DIAGNOSIS — I10 Essential (primary) hypertension: Secondary | ICD-10-CM

## 2024-01-03 ENCOUNTER — Other Ambulatory Visit: Payer: Self-pay

## 2024-01-03 NOTE — Patient Instructions (Signed)
 Visit Information  Thank you for taking time to visit with me today. Please don't hesitate to contact me if I can be of assistance to you before our next scheduled appointment.  Our next appointment is by telephone on 01/17/24 at 9:45 with Sara Banks Please call the care guide team at [phone] if you need to cancel or reschedule your appointment.   Following is a copy of your care plan:   Goals Addressed             This Visit's Progress    VBCI RN Care Plan   Improving    Problems:  Chronic Disease Management support and education needs related to DMII  Goal: Over the next 30 days the Patient will continue to work with RN Care Manager and/or Social Worker to address care management and care coordination needs related to DMII as evidenced by adherence to care management team scheduled appointments     take all medications exactly as prescribed and will call provider for medication related questions as evidenced by chart review and patient report    work with pharmacist to address polypharmacy related to DMII as evidenced by review of electronic medical record and patient or pharmacist report     Interventions:   Diabetes Interventions: Assessed patient's understanding of A1c goal: <7% Provided education to patient about basic DM disease process Reviewed medications with patient and discussed importance of medication adherence Counseled on importance of regular laboratory monitoring as prescribed Discussed plans with patient for ongoing care management follow up and provided patient with direct contact information for care management team Review of patient status, including review of consultants reports, relevant laboratory and other test results, and medications completed Lab Results  Component Value Date   HGBA1C 9.5 (H) 11/16/2023    Patient Self-Care Activities:  Attend all scheduled provider appointments Call pharmacy for medication refills 3-7 days in advance of  running out of medications Call provider office for new concerns or questions  Take medications as prescribed   Work with the pharmacist to address medication management needs and will continue to work with the clinical team to address health care and disease management related needs check blood sugar at prescribed times: once daily check feet daily for cuts, sores or redness switch to sugar-free drinks  Plan:  Telephone follow up appointment with care management team member scheduled for:  02/06/24 at 9:45             Please call the Suicide and Crisis Lifeline: 988 call the [place]  National Suicide Prevention Lifeline: 548-766-1361 or TTY: 279-692-6213 TTY 714-391-9568) to talk to a trained counselor call 1-800-273-TALK (toll free, 24 hour hotline) if you are experiencing a Mental Health or Behavioral Health Crisis or need someone to talk to.  Patient verbalizes understanding of instructions and care plan provided today and agrees to view in MyChart. Active MyChart status and patient understanding of how to access instructions and care plan via MyChart confirmed with patient.     SIGNATURE  Olam Idol BSN RN CCM [place]  [facility] RN Care Manager Direct Dial: [phone] Fax: [phone]

## 2024-01-03 NOTE — Telephone Encounter (Signed)
 Requesting: FARXIGA  5 MG TABS tablet  Last Visit: 11/16/2023 Next Visit: 12/31/2023 Last Refill: 11/23/2023  Please Advise   Pharmacy comment: REQUEST FOR 90 DAYS PRESCRIPTION.

## 2024-01-03 NOTE — Telephone Encounter (Signed)
 Requesting: LOSARTAN  POTASSIUM 25 MG TAB  Last Visit: 11/16/2023 Next Visit: 02/16/2024 Last Refill: 04/05/2023  Please Advise

## 2024-01-03 NOTE — Patient Outreach (Signed)
 Complex Care Management   Visit Note  01/03/2024  Name:  Sara Banks MRN: [id number] DOB: [birth year]  Situation: Referral received for Complex Care Management related to Diabetes with Complications I obtained verbal consent from Patient.  Visit completed with patient  on the phone  Background:   Past Medical History:  Diagnosis Date   Acute ischemic stroke of right pons 05/11/2013   Anxiety    Arthritis    Chronic pain    Depression    Diabetes mellitus without complication (HCC)    GERD (gastroesophageal reflux disease)    Hepatitis C dx 2014   [facility]   Neuropathy    Stroke (cerebrum) (HCC)     Assessment: Patient Reported Symptoms:  Cognitive Cognitive Status: Alert and oriented to person, place, and time      Neurological Neurological Review of Symptoms: Numbness Neurological Management Strategies: Medication therapy, Routine screening  HEENT HEENT Symptoms Reported: Other: (chronic cough possible related to relfux) HEENT Management Strategies: Routine screening    Cardiovascular Cardiovascular Symptoms Reported: No symptoms reported Does patient have uncontrolled Hypertension?: No Cardiovascular Management Strategies: Medication therapy, Routine screening  Respiratory Respiratory Symptoms Reported: Dry cough Respiratory Management Strategies: Diet modification, Routine screening  Endocrine Endocrine Symptoms Reported: No symptoms reported Is patient diabetic?: Yes Is patient checking blood sugars at home?: Yes List most recent blood sugar readings, include date and time of day: fasting today 150 .    Gastrointestinal Gastrointestinal Symptoms Reported: Reflux/heartburn Additional Gastrointestinal Details: still needs to reschedule with GI for follow up endoscopy, postponed due to high blood sugar Gastrointestinal Management Strategies: Medication therapy    Genitourinary Genitourinary Symptoms Reported: No symptoms reported    Integumentary  Integumentary Symptoms Reported: No symptoms reported    Musculoskeletal Musculoskelatal Symptoms Reviewed: Joint pain Additional Musculoskeletal Details: needs surgical repair, can't take time away from work Musculoskeletal Management Strategies: Medication therapy, Routine screening      Psychosocial              11/16/2023    8:30 AM  Depression screen PHQ 2/9  Decreased Interest 3  Down, Depressed, Hopeless 0  PHQ - 2 Score 3  Altered sleeping 3  Tired, decreased energy 3  Change in appetite 1  Feeling bad or failure about yourself  0  Trouble concentrating 3  Moving slowly or fidgety/restless 0  Suicidal thoughts 0  PHQ-9 Score 13  Difficult doing work/chores Extremely dIfficult    There were no vitals filed for this visit.  Medications Reviewed Today     Reviewed by Lonzell Planas, RN (Registered Nurse) on 01/03/24 at [phone]  Med List Status: <None>   Medication Order Taking? Sig Documenting Provider Last Dose Status Informant  Continuous Glucose Sensor (DEXCOM G7 SENSOR) MISC [id number]  Change sensor every 10 days McElwee, Lauren A, NP  Active   dapagliflozin  propanediol (FARXIGA ) 5 MG TABS tablet [id number]  Take 1 tablet (5 mg total) by mouth daily before breakfast. McElwee, Lauren A, NP  Active   DULoxetine  (CYMBALTA ) 30 MG capsule [id number]  Take 1 capsule (30 mg total) by mouth daily. McElwee, Lauren A, NP  Active   fenofibrate  micronized (LOFIBRA) 200 MG capsule [id number]  Take 1 capsule (200 mg total) by mouth daily before breakfast. McElwee, Lauren A, NP  Active   gabapentin  (NEURONTIN ) 300 MG capsule [id number]  TAKE 1 CAPSULE BY MOUTH THREE TIMES A DAY McElwee, Lauren A, NP  Active   glipiZIDE  (GLUCOTROL  XL) 10 MG 24 hr  tablet [id number]  TAKE 1 TABLET BY MOUTH EVERY DAY McElwee, Lauren A, NP  Active   ibuprofen  (ADVIL ) 600 MG tablet [id number]  Take 600 mg by mouth every 6 (six) hours as needed. [provider]  Active   insulin  aspart (NOVOLOG ) 100  UNIT/ML injection [id number]  Inject 4 Units into the skin daily. Take once a day with your biggest meal  Patient taking differently: Inject 12-20 Units into the skin once. With largest meal   McElwee, Lauren A, NP  Active   insulin  degludec (TRESIBA  FLEXTOUCH) 100 UNIT/ML FlexTouch Pen [id number]  Inject 30 Units into the skin daily.  Patient taking differently: Inject 10 Units into the skin daily.   McElwee, Lauren A, NP  Active   Insulin  Pen Needle (PEN NEEDLES) 32G X 5 MM MISC [id number]  1 Needle by Does not apply route daily. Webb, Padonda B, FNP  Active   losartan  (COZAAR ) 25 MG tablet [id number]  Take 1 tablet (25 mg total) by mouth daily. McElwee, Lauren A, NP  Active   omeprazole  (PRILOSEC) 40 MG capsule [id number]  Take 1 capsule (40 mg total) by mouth daily. McElwee, Lauren A, NP  Active   rosuvastatin  (CRESTOR ) 40 MG tablet [id number]  Take 1 tablet (40 mg total) by mouth daily. McElwee, Lauren A, NP  Active   tirzepatide  (MOUNJARO ) 2.5 MG/0.5ML Pen [id number] Yes Inject 2.5 mg into the skin once a week. Please bill manuf copay card secondary to ins- RXBIN: [id number] PCN: [id number] GRP: [id number] ID: [id number] Nedra Tinnie LABOR, NP  Active   traZODone  (DESYREL ) 100 MG tablet [id number]  TAKE 1 TABLET BY MOUTH EVERYDAY AT BEDTIME McElwee, Lauren A, NP  Active   VITAMIN D  PO [id number]  Take by mouth daily. [provider]  Active             Recommendation:   Continue Current Plan of Care  Follow Up Plan:   Telephone follow up appointment date/time:  01/17/24 at 9:45  SIG  Olam Idol BSN RN CCM [place]  [facility], [facility] RN Care Manager Direct Dial: [phone] Fax: [phone]

## 2024-01-10 ENCOUNTER — Other Ambulatory Visit: Payer: Self-pay

## 2024-01-10 ENCOUNTER — Telehealth: Payer: Self-pay

## 2024-01-10 NOTE — Progress Notes (Signed)
   01/10/2024  Patient ID: Sara Banks, female   DOB: 1961/03/09, 63 y.o.   MRN: [id number]  Subjective/Objective Patient outreach to follow-up on management of chronic conditions   Diabetes: Current medications: Tresiba  20-30units in the morning, Novolog  12-20 units daily with largest meal, Farxiga  5mg  daily, glipizide  XL 10mg  daily, Mounjaro  2.5mg  weekly  -Medications tried in the past: Ozempic  caused stomach upset and patient states Trulicity  did not work well but does not recall any adverse side effects from mediation. Jardiance  caused muscle and joint pain. -Using Dexcom G7 for CGM and data reflects avg BG 178 and GMI 7.6% over the past 14 days, with patient being in range 51% of the time -A1c on 6/4 of 9.5% -Patient has now had 3 doses of Mounjaro  2.5mg  weekly and is tolerating well.  She states she did have some nausea the 1st week, but this has resolved.  She has noticed a decrease in appetite, as well. -Diet consisting of fresh fruits, vegetables, and grilled chicken.  Patient endorses drinking plenty of water. -Novolog  sliding scale: BG 151-200 injects 12 units, BG 201-250 injects 14 units, 251-300 injects 16 units, 301-350 18 units, >350 injects 18 units -Patient is only using Novolog  once daily due to typically only having 1 actual meal per day due to work schedule (on call) -Patient previously followed by endocrinology with [facility] but canceled recent follow-up and has not rescheduled- endorses specialist visits are expensive for her   Hypertension: Current medications: losartan  25mg  daily -Does not monitor home BP regularly but plans to purchase monitor -BP at 6/4 OV was 110/78   Hyperlipidemia/ASCVD Risk Reduction Current lipid lowering medications: rosuvastatin  40mg  daily, fenofibrate  200mg  daily -Recently increased fenofibrate  to 200mg  daily based on elevated TG's Antiplatelet regimen: none- ASA causes GI upset   Difficulty Staying Asleep -Currently taking  trazodone  100mg  nightly for sleep with additional one-half if needed to fall back asleep in the middle of the night -Patient has take one-half tablet a couple of times now when waking during the middle of the night and not being able to go back to sleep.  Will only do this if she has at least another 4 hours she can sleep. -Endorses feeling more rested with improved energy   Assessment/Plan  Diabetes: -Currently uncontrolled but home BG reflects improved control -Recommend continuing Farxiga  5mg  daily, glipizide  XL 10mg  daily, Tresiba  20-30 units daily, Humalog 12-20 units with largest meal of the day -Continue Mounjaro  2.5mg  weekly another 5 weeks (has 1 pen at home and 1 refill of 4 pens remaining at pharmacy).  This will get patient to next visit with PCP on 9/4.  If continuing to tolerate well, increase to 5mg  weekly at that time and adjust insulin  according to home BG readings and A1c.  Consider 10% daily dose reduction. -Will be due for A1c at 9/4 visit   Hypertension: -Currently controlled -Continue current regimen -Recommend monitoring/recording home BP at least 3x/week   Hyperlipidemia/ASCVD Risk Reduction: -Currently controlled based on LDL<70; however, TG has been quite elevated -Continue current regimen -I recommend f/u lipid panel and CMP at upcoming visit   Difficulty Staying Asleep -Improving -Continue current regimen   Follow Up Plan: 2 months   Channing DELENA Mealing, PharmD, DPLA

## 2024-01-10 NOTE — Telephone Encounter (Signed)
 Copied from CRM [id number]. Topic: General - Call Back - No Documentation >> Jan 10, 2024  8:43 AM Viola F wrote: Reason for CRM: Patient returned pharmacist Cheryl's phone call

## 2024-01-17 ENCOUNTER — Other Ambulatory Visit: Payer: Self-pay

## 2024-01-17 NOTE — Patient Outreach (Signed)
 Complex Care Management   Visit Note  01/17/2024  Name:  Sara Banks MRN: [id number] DOB: 1960-11-24  Situation: Referral received for Complex Care Management related to Diabetes with Complications I obtained verbal consent from Patient.  Visit completed with patient  on the phone  Background:   Past Medical History:  Diagnosis Date   Acute ischemic stroke of right pons 05/11/2013   Anxiety    Arthritis    Chronic pain    Depression    Diabetes mellitus without complication (HCC)    GERD (gastroesophageal reflux disease)    Hepatitis C dx 2014   [facility]   Neuropathy    Stroke (cerebrum) (HCC)     Assessment: Patient Reported Symptoms:  Cognitive Cognitive Status: Alert and oriented to person, place, and time, Insightful and able to interpret abstract concepts, Normal speech and language skills      Neurological Neurological Review of Symptoms: Numbness (left pinky toe. patient reports unchanged. 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    Endocrine Endocrine Symptoms Reported: No symptoms reported Is patient diabetic?: Yes Is patient checking blood sugars at home?: Yes List most recent blood sugar readings, include date and time of day: patient reports BS this moring at 4am was 171. she states she does not go over 4  hours without eating. She is using a dexcom machine and states it has helped her to start to manage her BS better. Patient states per dexcom her A1C today is 7.5. Advised patient that about 2hour after eating goal for BS should be < 180. Patient reports she sees  Endocrinologist, Dr. Elsie Sharps.    Gastrointestinal Gastrointestinal Symptoms Reported: Reflux/heartburn Additional Gastrointestinal Details: Patient reports that she is using Mullein leaf and Peppermint tea. she also states she has decreased eating high fat meals and reports improvement in reflux symptoms. Patient states she will reach out to GI if needed.      Genitourinary Genitourinary Symptoms Reported: No symptoms reported    Integumentary Integumentary Symptoms Reported: No symptoms reported    Musculoskeletal Additional Musculoskeletal Details: patient reports chronic left arm arm/hand  discomfort. she states it is unchanged and does not interfer with daily routine. she is unable to rate it on the pain scale and states she has seen [facility] in the past and will follow up with them if needed.        Psychosocial Psychosocial Symptoms Reported: No symptoms reported Additional Psychological Details: patient debated if she has any depression, PHQ 2 taken today is 1. Patient states she does not need to speak with LCSW at this time. But is aware that services are available.            01/17/2024   10:18 AM  Depression screen PHQ 2/9  Decreased Interest 1  Down, Depressed, Hopeless 0  PHQ - 2 Score 1    There were no vitals filed for this visit.  Medications Reviewed Today     Reviewed by Thania Woodlief M, RN (Registered Nurse) on 01/17/24 at 1002  Med List Status: <None>   Medication Order Taking? Sig Documenting Provider Last Dose Status Informant  Continuous Glucose Sensor (DEXCOM G7 SENSOR) MISC [id number] Yes Change  sensor every 10 days McElwee, Lauren A, NP  Active   DULoxetine  (CYMBALTA ) 30 MG capsule [id number] Yes Take 1 capsule (30 mg total) by mouth daily. McElwee, Lauren A, NP  Active   FARXIGA  5 MG TABS tablet [id number] Yes TAKE 1 TABLET BY MOUTH DAILY BEFORE BREAKFAST. McElwee, Lauren A, NP  Active   fenofibrate  micronized (LOFIBRA) 200 MG capsule [id number]  Yes Take 1 capsule (200 mg total) by mouth daily before breakfast. McElwee, Lauren A, NP  Active   gabapentin  (NEURONTIN ) 300 MG capsule [id number] Yes TAKE 1 CAPSULE BY MOUTH THREE TIMES A DAY McElwee, Lauren A, NP  Active   glipiZIDE  (GLUCOTROL  XL) 10 MG 24 hr tablet [id number] Yes TAKE 1 TABLET BY MOUTH EVERY DAY McElwee, Lauren A, NP  Active   ibuprofen  (ADVIL ) 600 MG tablet [id number]  Take 600 mg by mouth every 6 (six) hours as needed.  Patient not taking: Reported on 01/17/2024   [provider]  Active   insulin  aspart (NOVOLOG ) 100 UNIT/ML injection [id number]  Inject 4 Units into the skin daily. Take once a day with your biggest meal  Patient taking differently: Inject 12-20 Units into the skin once. With largest meal   McElwee, Lauren A, NP  Active   insulin  degludec (TRESIBA  FLEXTOUCH) 100 UNIT/ML FlexTouch Pen [id number] Yes Inject 30 Units into the skin daily. McElwee, Lauren A, NP  Active   Insulin  Pen Needle (PEN NEEDLES) 32G X 5 MM MISC [id number]  1 Needle by Does not apply route daily. Webb, Padonda B, FNP  Active   losartan  (COZAAR ) 25 MG tablet [id number] Yes TAKE 1 TABLET (25 MG TOTAL) BY MOUTH DAILY. McElwee, Lauren A, NP  Active   omeprazole  (PRILOSEC) 40 MG capsule [id number] Yes Take 1 capsule (40 mg total) by mouth daily. McElwee, Lauren A, NP  Active   rosuvastatin  (CRESTOR ) 40 MG tablet [id number] Yes Take 1 tablet (40 mg total) by mouth daily. McElwee, Lauren A, NP  Active   tirzepatide  (MOUNJARO ) 2.5 MG/0.5ML Pen [id number] Yes Inject 2.5 mg into the skin once a week. Please bill manuf copay card secondary to ins- RXBIN: [id number] PCN: [id number] GRP: [id number] ID: [id number] Nedra Tinnie LABOR, NP  Active   traZODone  (DESYREL ) 100 MG tablet [id number] Yes TAKE 1 TABLET BY MOUTH EVERYDAY AT BEDTIME McElwee, Lauren A, NP  Active   VITAMIN D  PO [id number] Yes Take by mouth daily. [provider]  Active           Recommendation:   Continue Current Plan of Care  Follow  Up Plan:   Telephone follow up appointment date/time:  02/21/24 at 9:00 am  Heddy Shutter, RN, MSN, BSN, CCM [place]  [facility], Population Health Case Manager Phone: [phone]

## 2024-01-17 NOTE — Patient Instructions (Signed)
 Visit Information  Thank you for taking time to visit with me today. Please don't hesitate to contact me if I can be of assistance to you before our next scheduled appointment.  Your next care management appointment is by telephone on 02/21/24 at 9:00 am   Please call the care guide team at [phone] if you need to cancel, schedule, or reschedule an appointment.   Please call the Suicide and Crisis Lifeline: 988 call the [place]  National Suicide Prevention Lifeline: (616)801-3764 or TTY: (724)406-8478 TTY (406)358-5681) to talk to a trained counselor call 1-800-273-TALK (toll free, 24 hour hotline) if you are experiencing a Mental Health or Behavioral Health Crisis or need someone to talk to.  Heddy Shutter, RN, MSN, BSN, CCM [place]  [facility], Population Health Case Manager Phone: [phone]

## 2024-01-31 NOTE — Progress Notes (Signed)
   01/31/2024  Patient ID: Sara Banks, female   DOB: 05/13/1961, 63 y.o.   MRN: [id number]  In basket message from [facility] working with patient to inform me of supplements patient is using in tea form.  Interaction check performed along with patient's current medications to assess any potential DDI's or CI's.  No major interactions or concerns noted.  Peppermint:  Omeprazole : (CYTOCHROME P450 2C19 (CYP2C19) SUBSTRATES): Minor Theoretically, peppermint might increase the levels of CYP2C19 substrates. In vitro research shows that peppermint oil inhibits CYP2C19 (12479). So far, this interaction has not been reported in humans. Duloxetine : CYTOCHROME P450 1A2 (CYP1A2) SUBSTRATES: Minor Theoretically, peppermint might increase the levels of CYP1A2 substrates. In vitro and animal research shows that peppermint oil and peppermint leaf inhibit CYP1A2. However, in clinical research, peppermint tea did not significantly affect the metabolism of caffeine, a CYP1A2 substrate. It is possible that the 6-day duration of treatment may have been too short to identify a difference Rosuvastatin : CYTOCHROME P450 2C9 (CYP2C9) SUBSTRATES: Moderate Theoretically, peppermint might increase the levels of CYP2C9 substrates. In vitro research shows that peppermint oil inhibits CYP2C9. So far, this interaction has not been reported in humans  Magnesium: Likely safe when used orally and appropriately. Oral magnesium is safe when used in doses below the tolerable upper intake level (UL) of 350 mg daily  Mullein: When the seeds are ingested. They are reported to be toxic. There is insufficient reliable information available about the safe  Sara Banks, PharmD, DPLA

## 2024-02-16 ENCOUNTER — Ambulatory Visit: Payer: Self-pay | Admitting: Nurse Practitioner

## 2024-02-16 ENCOUNTER — Ambulatory Visit: Admitting: Nurse Practitioner

## 2024-02-16 ENCOUNTER — Encounter: Payer: Self-pay | Admitting: Nurse Practitioner

## 2024-02-16 VITALS — BP 132/84 | HR 78 | Temp 97.0°F | Ht 65.0 in | Wt 201.4 lb

## 2024-02-16 DIAGNOSIS — R0789 Other chest pain: Secondary | ICD-10-CM | POA: Diagnosis not present

## 2024-02-16 DIAGNOSIS — Z7985 Long-term (current) use of injectable non-insulin antidiabetic drugs: Secondary | ICD-10-CM | POA: Diagnosis not present

## 2024-02-16 DIAGNOSIS — Z794 Long term (current) use of insulin: Secondary | ICD-10-CM

## 2024-02-16 DIAGNOSIS — E1140 Type 2 diabetes mellitus with diabetic neuropathy, unspecified: Secondary | ICD-10-CM

## 2024-02-16 DIAGNOSIS — R42 Dizziness and giddiness: Secondary | ICD-10-CM

## 2024-02-16 DIAGNOSIS — M2550 Pain in unspecified joint: Secondary | ICD-10-CM | POA: Diagnosis not present

## 2024-02-16 DIAGNOSIS — Z7984 Long term (current) use of oral hypoglycemic drugs: Secondary | ICD-10-CM

## 2024-02-16 DIAGNOSIS — F419 Anxiety disorder, unspecified: Secondary | ICD-10-CM

## 2024-02-16 DIAGNOSIS — G44209 Tension-type headache, unspecified, not intractable: Secondary | ICD-10-CM

## 2024-02-16 LAB — COMPREHENSIVE METABOLIC PANEL WITH GFR
ALT: 22 U/L (ref 0–35)
AST: 18 U/L (ref 0–37)
Albumin: 4.4 g/dL (ref 3.5–5.2)
Alkaline Phosphatase: 73 U/L (ref 39–117)
BUN: 13 mg/dL (ref 6–23)
CO2: 23 meq/L (ref 19–32)
Calcium: 9.1 mg/dL (ref 8.4–10.5)
Chloride: 105 meq/L (ref 96–112)
Creatinine, Ser: 0.88 mg/dL (ref 0.40–1.20)
GFR: 68.94 mL/min (ref >60.00)
Glucose, Bld: 220 mg/dL — ABNORMAL HIGH (ref 70–99)
Potassium: 4.3 meq/L (ref 3.5–5.1)
Sodium: 137 meq/L (ref 135–145)
Total Bilirubin: 0.3 mg/dL (ref 0.2–1.2)
Total Protein: 7 g/dL (ref 6.0–8.3)

## 2024-02-16 LAB — CBC WITH DIFFERENTIAL/PLATELET
Basophils Absolute: 0 K/uL (ref 0.0–0.1)
Basophils Relative: 0.6 % (ref 0.0–3.0)
Eosinophils Absolute: 0.2 K/uL (ref 0.0–0.7)
Eosinophils Relative: 3.8 % (ref 0.0–5.0)
HCT: 47.2 % — ABNORMAL HIGH (ref 36.0–46.0)
Hemoglobin: 16.2 g/dL — ABNORMAL HIGH (ref 12.0–15.0)
Lymphocytes Relative: 33.2 % (ref 12.0–46.0)
Lymphs Abs: 2 K/uL (ref 0.7–4.0)
MCHC: 34.3 g/dL (ref 30.0–36.0)
MCV: 90.5 fl (ref 78.0–100.0)
Monocytes Absolute: 0.4 K/uL (ref 0.1–1.0)
Monocytes Relative: 6.8 % (ref 3.0–12.0)
Neutro Abs: 3.3 K/uL (ref 1.4–7.7)
Neutrophils Relative %: 55.6 % (ref 43.0–77.0)
Platelets: 275 K/uL (ref 150.0–400.0)
RBC: 5.22 Mil/uL — ABNORMAL HIGH (ref 3.87–5.11)
RDW: 13.4 % (ref 11.5–15.5)
WBC: 5.9 K/uL (ref 4.0–10.5)

## 2024-02-16 LAB — URIC ACID: Uric Acid, Serum: 3.6 mg/dL (ref 2.4–7.0)

## 2024-02-16 LAB — HEMOGLOBIN A1C: Hgb A1c MFr Bld: 9 % — ABNORMAL HIGH (ref 4.6–6.5)

## 2024-02-16 MED ORDER — DULOXETINE HCL 60 MG PO CPEP
60.0000 mg | ORAL_CAPSULE | Freq: Every day | ORAL | 1 refills | Status: AC
Start: 2024-02-16 — End: ?

## 2024-02-16 MED ORDER — TRESIBA FLEXTOUCH 100 UNIT/ML ~~LOC~~ SOPN: 25.0000 [IU] | PEN_INJECTOR | Freq: Every day | SUBCUTANEOUS | 2 refills | Status: AC

## 2024-02-16 MED ORDER — TIRZEPATIDE 5 MG/0.5ML ~~LOC~~ SOAJ
5.0000 mg | SUBCUTANEOUS | 0 refills | Status: DC
Start: 2024-02-16 — End: 2024-03-26

## 2024-02-16 NOTE — Progress Notes (Signed)
 Established Patient Office Visit  Subjective   Patient ID: Sara Banks, female    DOB: [birth year]  Age: 63 y.o. MRN: [id number]  Chief Complaint  Patient presents with   Diabetes    Follow up, concerns with joint pain and stiffness in left hand, request uric acid level checked, concerns with headaches, anxiety, and sweating a lot for 3 months,  pain radiating in leg arm and left leg for months    HPI  Discussed the use of AI scribe software for clinical note transcription with the patient, who gave verbal consent to proceed.  History of Present Illness   Sara Banks is a 63 year old female with diabetes who presents with joint pain and anxiety.  She experiences significant joint pain in her left hand and arm, ongoing for several months. The pain is severe, hindering tasks like writing, and is accompanied by swelling, shooting pain, and occasional numbness and tingling. An x-ray at [facility] indicated tendinitis, and she was placed in a splint, which she found unhelpful.  Her diabetes is more controlled, with blood sugar levels typically under 200 mg/dL. She follows a diet of salads with roasted chicken and minimal dressing in the evenings, and a homemade sausage and egg biscuit for breakfast. She uses Tresiba  insulin  at 30 units daily and has reduced Novolog  use due to stable blood sugar levels. Occasional hypoglycemic episodes occur, with blood sugar dropping below 40 mg/dL, managed with crackers or mints. She uses a Dexcom continuous glucose monitor on her abdomen.  She experiences daily headaches located at the back of her head, associated with stress and anxiety. She has a history of a stroke and fears recurrence. Increased anxiety is noted, particularly during a recent drive from [place] , where she felt overwhelming depression and anxiety. Dizziness occurs upon bending over or taking deep breaths.  She takes Mounjaro  weekly without side effects and uses gabapentin  at night,  though she feels it is ineffective. She is allergic to aspirin , which causes stomach upset. She previously took Farxiga  but discontinued it due to reduced urination and swelling.        ROS See pertinent positives and negatives per HPI.    Objective:     BP 132/84 (BP Location: Right Arm, Patient Position: Sitting, Cuff Size: Normal)   Pulse 78   Temp (!) 97 F (36.1 C)   Ht 5' 5 (1.651 m)   Wt 201 lb 6.4 oz (91.4 kg)   SpO2 96%   BMI 33.51 kg/m  BP Readings from Last 3 Encounters:  02/16/24 132/84  11/16/23 110/78  08/15/23 126/84   Wt Readings from Last 3 Encounters:  02/16/24 201 lb 6.4 oz (91.4 kg)  11/16/23 200 lb (90.7 kg)  08/15/23 206 lb 9.6 oz (93.7 kg)   Orthostatic Vitals for the past 48 hrs (Last 6 readings):  Patient Position Orthostatic BP Orthostatic Pulse BP Pulse BP Location Cuff Size  02/16/24 0911 Sitting -- -- 128/82 78 Left Arm Normal  02/16/24 0921 Sitting -- -- 132/84 -- Right Arm Normal  02/16/24 1008 Supine 118/80 72 -- -- Right Arm Normal  02/16/24 1009 Sitting 118/80 76 -- -- Right Arm Normal  02/16/24 1010 Standing 120/80 79 -- -- Right Arm Normal     Physical Exam Vitals and nursing note reviewed.  Constitutional:      General: She is not in acute distress.    Appearance: Normal appearance.  HENT:     Head: Normocephalic.  Eyes:  Conjunctiva/sclera: Conjunctivae normal.  Neck:     Vascular: No carotid bruit.  Cardiovascular:     Rate and Rhythm: Normal rate and regular rhythm.     Pulses: Normal pulses.     Heart sounds: Normal heart sounds.  Pulmonary:     Effort: Pulmonary effort is normal.     Breath sounds: Normal breath sounds.  Musculoskeletal:        General: Tenderness (left MCP joints) present.     Cervical back: Normal range of motion.  Skin:    General: Skin is warm.  Neurological:     General: No focal deficit present.     Mental Status: She is alert and oriented to person, place, and time.  Psychiatric:         Mood and Affect: Mood normal.        Behavior: Behavior normal.        Thought Content: Thought content normal.        Judgment: Judgment normal.      Assessment & Plan:   Problem List Items Addressed This Visit       Endocrine   Type 2 diabetes mellitus with diabetic neuropathy, with long-term current use of insulin  (HCC)   Chronic, improving. Blood glucose levels readings are under 200 using Mounjaro  2.5mg  weekly and Tresiba  30 units daily effectively, though occasional hypoglycemia is noted. Farxiga  was discontinued due to urinary retention. Increase Mounjaro  to 5 mg weekly and decrease Tresiba  to 25 units daily. Refill Tresiba  prescription. Adjust insulin  if blood glucose is less than 70. Discontinue Farxiga  and continue dietary modifications. Check CMP, CBC, A1c today.       Relevant Medications   tirzepatide  (MOUNJARO ) 5 MG/0.5ML Pen   insulin  degludec (TRESIBA  FLEXTOUCH) 100 UNIT/ML FlexTouch Pen   Other Relevant Orders   Hemoglobin A1c   Comprehensive metabolic panel with GFR   CBC with Differential/Platelet     Other   Anxiety   Chronic, not controlled. Increased anxiety and stress with episodes of depression are noted, related to health concerns and work stress. Duloxetine  and gabapentin  may help with anxiety and pain. Increase duloxetine  to 60 mg daily.      Relevant Medications   DULoxetine  (CYMBALTA ) 60 MG capsule   Long term current use of oral hypoglycemic drug   Continue glipizide  xl 10mg  daily       Long-term current use of injectable noninsulin antidiabetic medication   Increase mounjaro  to 5 mg weekly.      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 Request x-ray from previous visit and check uric acid levels. Last ANA was negative. Continue using ice and Biofreeze for pain relief. Increase duloxetine  to 60 mg and gabapentin  to 600mg  at bedtime.      Relevant  Orders   Uric acid   Chest pressure - Primary   She experienced an episode of chest pressure last week while driving. She was also experiencing anxiety. EKG shows normal sinus rhythm with a heart rate of 69, no ST or T wave changes. Check CMP, CBC, today. May be from anxiety attack. Increase duloxetine  as noted above.       Relevant Orders   EKG 12-Lead (Completed)   Tension headache   Daily occipital headaches are possibly related to stress and neck stiffness. She cannot use aspirin  due to an allergy. Increase duloxetine  to 60 mg and consider stress management techniques.  Relevant Medications   DULoxetine  (CYMBALTA ) 60 MG capsule   Other Visit Diagnoses       Dizziness       Dizziness occurs with bending or deep breaths, with ongoing symptoms. EKG and orthostatics negative. Check CMP, CBC. Encourage fluids. Change positions slowly.      Return in about 3 months (around 05/17/2024) for Diabetes.    [name], NP

## 2024-02-16 NOTE — Assessment & Plan Note (Signed)
 Daily occipital headaches are possibly related to stress and neck stiffness. She cannot use aspirin  due to an allergy. Increase duloxetine  to 60 mg and consider stress management techniques.

## 2024-02-16 NOTE — Assessment & Plan Note (Signed)
 She experienced an episode of chest pressure last week while driving. She was also experiencing anxiety. EKG shows normal sinus rhythm with a heart rate of 69, no ST or T wave changes. Check CMP, CBC, today. May be from anxiety attack. Increase duloxetine  as noted above.

## 2024-02-16 NOTE — Assessment & Plan Note (Signed)
 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 Request x-ray from previous visit and check uric acid levels. Last ANA was negative. Continue using ice and Biofreeze for pain relief. Increase duloxetine  to 60 mg and gabapentin  to 600mg  at bedtime.

## 2024-02-16 NOTE — Patient Instructions (Signed)
 It was great to see you!  We are checking your labs today and will let you know the results via mychart/phone.   Increase your duloxetine  to 60mg  daily   Increase gabapentin  to 2 tablets at bedtime   Increase mounjaro  to 5 mg weekly   Decrease tresiba  to 25 units daily   Let's follow-up in 3 months, sooner if you have concerns.  If a referral was placed today, you will be contacted for an appointment. Please note that routine referrals can sometimes take up to 3-4 weeks to process. Please call our office if you haven't heard anything after this time frame.  Take care,  Tinnie Harada, NP

## 2024-02-16 NOTE — Assessment & Plan Note (Signed)
 Chronic, improving. Blood glucose levels readings are under 200 using Mounjaro  2.5mg  weekly and Tresiba  30 units daily effectively, though occasional hypoglycemia is noted. Farxiga  was discontinued due to urinary retention. Increase Mounjaro  to 5 mg weekly and decrease Tresiba  to 25 units daily. Refill Tresiba  prescription. Adjust insulin  if blood glucose is less than 70. Discontinue Farxiga  and continue dietary modifications. Check CMP, CBC, A1c today.

## 2024-02-16 NOTE — Assessment & Plan Note (Signed)
 Chronic, not controlled. Increased anxiety and stress with episodes of depression are noted, related to health concerns and work stress. Duloxetine  and gabapentin  may help with anxiety and pain. Increase duloxetine  to 60 mg daily.

## 2024-02-16 NOTE — Progress Notes (Signed)
 EKG interpreted by me on 02/16/24 showed normal sinus rhythm with a heart rate of 69. No ST or T wave changes.

## 2024-02-16 NOTE — Assessment & Plan Note (Signed)
Continue glipizide xl 10mg  daily

## 2024-02-16 NOTE — Assessment & Plan Note (Signed)
Increase mounjaro to 5mg  weekly

## 2024-02-21 ENCOUNTER — Other Ambulatory Visit: Payer: Self-pay

## 2024-02-21 NOTE — Patient Instructions (Signed)
 Sara Banks - I am sorry I was unable to reach you today for our scheduled appointment. I work with Nedra Tinnie LABOR, NP and am calling to support your healthcare needs. Please contact me at [phone] at your earliest convenience. I look forward to speaking with you soon.   Thank you,   Heddy Shutter, RN, MSN, BSN, CCM [place]  [facility], Population Health Case Manager Phone: [phone]

## 2024-02-21 NOTE — Patient Outreach (Signed)
 Complex Care Management   Visit Note  02/21/2024  Name:  Sara Banks MRN: [id number] DOB: 1960-06-29  Situation: Referral received for Complex Care Management related to Diabetes with Complications I obtained verbal consent from Patient.  Visit completed with Patient  on the phone  Background:   Past Medical History:  Diagnosis Date   Acute ischemic stroke of right pons 05/11/2013   Anxiety    Arthritis    Chronic pain    Depression    Diabetes mellitus without complication (HCC)    GERD (gastroesophageal reflux disease)    Hepatitis C dx 2014   [facility]   Neuropathy    Stroke (cerebrum) (HCC)     Assessment: Patient Reported Symptoms:  Cognitive Cognitive Status: No symptoms reported      Neurological Neurological Review of Symptoms: Numbness (unchanged)    HEENT HEENT Symptoms Reported: No symptoms reported      Cardiovascular Cardiovascular Symptoms Reported: No symptoms reported Does patient have uncontrolled Hypertension?: No Cardiovascular Comment: patient questions why she has an appointment with Dr. Mona. discussed with patient, per review of chart referral regarding hypderlipidemia.  Respiratory Respiratory Symptoms Reported: No symptoms reported Other Respiratory Symptoms: reports the cough is better for since the last two weeks    Endocrine Endocrine Symptoms Reported: No symptoms reported Is patient diabetic?: Yes Is patient checking blood sugars at home?: Yes (Dexcom CGM) List most recent blood sugar readings, include date and time of day: patient reports BS this morning 187 fasting reports. last saw PCP on 02/16/24. Patient reports is working on portion control to help control BS in addition to medication. Patient report she is taking medications as prescribed with the dosage change at PCP visit. Patient reports she is using the sliding scale for Novolog . Clinical pharmacist continues to follow-next scheduled call 03/13/24 . She has upcoming  appointment with endocrinology 05/09/24    Gastrointestinal Gastrointestinal Symptoms Reported: No symptoms reported Additional Gastrointestinal Details: reports improved      Genitourinary Genitourinary Symptoms Reported: No symptoms reported    Integumentary Integumentary Symptoms Reported: No symptoms reported    Musculoskeletal Additional Musculoskeletal Details: left hand/arm  arthritis 5        Psychosocial Psychosocial Symptoms Reported: No symptoms reported         There were no vitals filed for this visit.  Medications Reviewed Today     Reviewed by Sherri Levenhagen M, RN (Registered Nurse) on 02/21/24 at [phone]  Med List Status: <None>   Medication Order Taking? Sig Documenting Provider Last Dose Status Informant  Continuous Glucose Sensor (DEXCOM G7 SENSOR) MISC [id number]  Change sensor every 10 days McElwee, Lauren A, NP  Active   DULoxetine  (CYMBALTA ) 60 MG capsule [id number] Yes Take 1 capsule (60 mg total) by mouth daily. McElwee, Lauren A, NP  Active   fenofibrate  micronized (LOFIBRA) 200 MG capsule [id number] Yes Take 1 capsule (200 mg total) by mouth daily before breakfast. McElwee, Lauren A, NP  Active   gabapentin  (NEURONTIN ) 300 MG capsule [id number] Yes TAKE 1 CAPSULE BY MOUTH THREE TIMES A DAY McElwee, Lauren A, NP  Active   glipiZIDE  (GLUCOTROL  XL) 10 MG 24 hr tablet [id number] Yes TAKE 1 TABLET BY MOUTH EVERY DAY McElwee, Lauren A, NP  Active   ibuprofen  (ADVIL ) 600 MG tablet [id number] Yes Take 600 mg by mouth every 6 (six) hours as needed. [provider]  Active   insulin  aspart (NOVOLOG ) 100 UNIT/ML injection [id number] Yes Inject 4 Units into  the skin daily. Take once a day with your biggest meal  Patient taking differently: Inject 12-20 Units into the skin once. With largest meal   McElwee, Lauren A, NP  Active   insulin  degludec (TRESIBA  FLEXTOUCH) 100 UNIT/ML FlexTouch Pen [id number] Yes Inject 25 Units into the skin daily. McElwee, Lauren A, NP   Active   Insulin  Pen Needle (PEN NEEDLES) 32G X 5 MM MISC [id number]  1 Needle by Does not apply route daily. Webb, Padonda B, FNP  Active   losartan  (COZAAR ) 25 MG tablet [id number] Yes TAKE 1 TABLET (25 MG TOTAL) BY MOUTH DAILY. McElwee, Lauren A, NP  Active   omeprazole  (PRILOSEC) 40 MG capsule [id number] Yes Take 1 capsule (40 mg total) by mouth daily. McElwee, Lauren A, NP  Active   rosuvastatin  (CRESTOR ) 40 MG tablet [id number] Yes Take 1 tablet (40 mg total) by mouth daily. McElwee, Lauren A, NP  Active   tirzepatide  (MOUNJARO ) 5 MG/0.5ML Pen [id number] Yes Inject 5 mg into the skin once a week. Nedra Tinnie LABOR, NP  Active   traZODone  (DESYREL ) 100 MG tablet [id number] Yes TAKE 1 TABLET BY MOUTH EVERYDAY AT BEDTIME McElwee, Lauren A, NP  Active   VITAMIN D  PO [id number] Yes Take by mouth daily. [provider]  Active           Recommendation:   Continue Current Plan of Care Patient request refill for Ibuprofen  600mg -RNCM will update PCP RNCM will also communicate/update clinical pharmacist(patient taking Novolog  Differently)   Follow Up Plan:   Telephone follow up appointment date/time:  03/22/24 at 9:00 am  Heddy Shutter, RN, MSN, BSN, CCM [place]  [facility], Population Health Case Manager Phone: [phone]

## 2024-02-22 ENCOUNTER — Other Ambulatory Visit: Payer: Self-pay | Admitting: Nurse Practitioner

## 2024-02-22 ENCOUNTER — Telehealth: Payer: Self-pay

## 2024-02-22 MED ORDER — IBUPROFEN 600 MG PO TABS: 600.0000 mg | ORAL_TABLET | Freq: Four times a day (QID) | ORAL | 1 refills | Status: AC | PRN

## 2024-02-22 NOTE — Patient Outreach (Signed)
 Complex Care Management   Visit Note  02/22/2024  Name:  Sara Banks MRN: [id number] DOB: 1961-05-10  Situation: Referral received for Complex Care Management related to Diabetes with Complications I obtained verbal consent from Patient.  Visit completed with Patient  on the phone  Background:   Past Medical History:  Diagnosis Date   Acute ischemic stroke of right pons 05/11/2013   Anxiety    Arthritis    Chronic pain    Depression    Diabetes mellitus without complication (HCC)    GERD (gastroesophageal reflux disease)    Hepatitis C dx 2014   [facility]   Neuropathy    Stroke (cerebrum) (HCC)     Assessment: Per Channing Mealing, RPH, patient has been using 12-20 units with her largest meal of the day. RNCM called to clarify with patient how she is taking Novolog . Per patient, I don't eat big meals. when I have eaten something that I know will bring my sugar up, I will wait to check my sugar to see if it spikes my blood sugar to take the insulin . Patient reports she has not had to take a shot but once in the last two weeks.SABRASABRABecause I have reduced my meals. Instead of eating 3 meals a day I reduce my intake of food and quantity of food and I only eating when I get hungry, and sometimes that is in the morning and sometimes it is not. It just varies. Patient states the Monjoura is helping with controlling cravings and how often she eats.   Meals: usually eats a salad for breakfast if she eats breakfast, then she wont eat till later. Patient states she does not eat out. Patient reports Blood sugars at 70 twice over the last two weeks because she did not eat all day and then she ate and blood sugar came up. Patient reports BS usually 120- 256. She states BS 164 currently (had coffee w/ cream) and reports has been 164 since last night. I don't eat big meals anymore, small proportions and only when I get hungry. Patient Reported Symptoms: none  Recommendation:   Continue  Current Plan of Care PCP and Clinical Pharmacist updated  Follow Up Plan:   Follow up as previously scheduled  Heddy Shutter, RN, MSN, BSN, CCM [place]  [facility], Population Health Case Manager Phone: [phone]

## 2024-02-22 NOTE — Patient Instructions (Signed)
 Visit Information  Thank you for taking time to visit with me today. Please don't hesitate to contact me if I can be of assistance to you before our next scheduled appointment.  Your next care management appointment is by telephone on 03/22/24 at 9am  Please call the care guide team at [phone] if you need to cancel, schedule, or reschedule an appointment.   Please call the Suicide and Crisis Lifeline: 988 call the [place]  National Suicide Prevention Lifeline: 630-262-2914 or TTY: 325-797-8496 TTY 787-504-2895) to talk to a trained counselor call 1-800-273-TALK (toll free, 24 hour hotline) if you are experiencing a Mental Health or Behavioral Health Crisis or need someone to talk to.  Heddy Shutter, RN, MSN, BSN, CCM [place]  [facility], Population Health Case Manager Phone: [phone]

## 2024-02-24 ENCOUNTER — Telehealth: Payer: Self-pay

## 2024-02-24 DIAGNOSIS — E782 Mixed hyperlipidemia: Secondary | ICD-10-CM

## 2024-02-24 NOTE — Telephone Encounter (Signed)
 I called and spoke with patient and she wanted to get this checked sooner. Patient scheduled for a lab visit for Monday and will be fasting. Can you place the order?

## 2024-02-24 NOTE — Telephone Encounter (Signed)
-----   Message from [name] sent at 02/23/2024  3:04 PM EDT ----- She can come in for a fasting lab visit or we can check it at her next visit. ----- Message ----- From: Gladis Sharps, Laymon GRADE, CMA Sent: 02/23/2024   3:03 PM EDT To: [name], NP  Sorry I called lab and the blood has already been thrown away. ----- Message ----- From: Harada Tinnie DELENA, NP Sent: 02/22/2024   9:13 AM EDT To: Laymon GRADE Gladis Sharps, CMA  I meant this labs drawn 6 days ago. If she was fasting we can see if we can still add on the cholesterol to those labs. ----- Message ----- From: Gladis Sharps, Laymon GRADE, CMA Sent: 02/22/2024   8:40 AM EDT To: [name], NP  I called and spoke with patient and she said that she was fasting when she had her cholesterol checked in June. Thanks, BMS/CMA ----- Message ----- From: Harada Tinnie DELENA, NP Sent: 02/22/2024   8:28 AM EDT To: Laymon GRADE Gladis Sharps, CMA  Was she fasting? IF she was, can we add on a lipid panel? ----- Message ----- From: Gladis Sharps, Laymon GRADE, CMA Sent: 02/21/2024   1:16 PM EDT To: [name], NP  I called and spoke with patient and notified her of below message. Also patient said that her cholesterol and not tested at last visit but was tested back in June and unsure if the fenofibrate  is helping her.  Thanks, BMS/CMA ----- Message ----- From: Harada Tinnie DELENA, NP Sent: 02/20/2024   4:36 PM EDT To: Laymon GRADE Gladis Sharps, CMA  Please let Corrine know that we got her xrays and it does look like she has arthritis in her fingers that is causing the pain. She can take tylenol  arthritis as needed for pain. She can get this from any pharmacy. ----- Message ----- From: Gladis Sharps, Laymon GRADE, CMA Sent: 02/20/2024   4:29 PM EDT To: [name], NP  Please view medical records below. Thanks, BMS/CMA ----- Message ----- From: Joesph Jonas KIDD Sent: 02/17/2024   2:56 PM EDT ToBETHA Harada Team  Please  abstract and route to provider.

## 2024-02-24 NOTE — Telephone Encounter (Signed)
 Noted

## 2024-02-27 ENCOUNTER — Other Ambulatory Visit

## 2024-03-13 ENCOUNTER — Other Ambulatory Visit: Payer: Self-pay

## 2024-03-13 DIAGNOSIS — E1140 Type 2 diabetes mellitus with diabetic neuropathy, unspecified: Secondary | ICD-10-CM

## 2024-03-13 NOTE — Progress Notes (Signed)
   03/13/2024  Patient ID: Sara Banks, female   DOB: [birth year], 63 y.o.   MRN: [id number]  Subjective/Objective Patient outreach to follow-up on management of diabetes   Diabetes: Current medications: Tresiba  25units in the morning, Novolog  12-20 units daily with largest meal, glipizide  XL 10mg  daily, Mounjaro  5mg  weekly  -Medications tried in the past: Ozempic  caused stomach upset and patient states Trulicity  did not work well but does not recall any adverse side effects from mediation. Jardiance  caused muscle and joint pain, and Farxiga  was recently stopped due to urinary retention -Using Dexcom G7 for CGM and data reflects avg BG 186 and GMI 7.8% over the past 90 days -A1c on 9/4 9.0%, down from 9.5% in June -Patient has now had 2 doses of Mounjaro  5mg  weekly and is tolerating well.   -Diet consisting of fresh fruits, vegetables, and grilled chicken.  Patient endorses drinking plenty of water. -Novolog  sliding scale: BG 151-200 injects 12 units, BG 201-250 injects 14 units, 251-300 injects 16 units, 301-350 18 units, >350 injects 18 units -Patient is only using Novolog  once daily due to typically only having 1-2 actual meals per day due to work schedule (on call); patient is not always using Novolog  as prescribed due to some hypoglycemia after use. She states blood glucose will spike after meals but comes down quickly and will go too  low when Novolog  is used sometimes.  Has had BG readings in the 40's but states this does not occur more than once a month, and Dexcom will alert her; resolved with some candy. -Patient previously followed by endocrinology with [facility] but canceled recent follow-up and has not rescheduled- endorses specialist visits are expensive for her -Has had 2-3 Dexcom G7 sensors malfunction, so she is down to 1 remaining; and insurance likely will not cover another refill until the middle/end of October   Hypertension: Current medications: losartan  25mg   daily -Does not monitor home BP regularly but plans to purchase monitor -BP at 9/4 OV was 132/84   Hyperlipidemia/ASCVD Risk Reduction Current lipid lowering medications: rosuvastatin  40mg  daily, fenofibrate  200mg  daily -Recently increased fenofibrate  to 200mg  daily based on elevated TG's Antiplatelet regimen: none- ASA causes GI upset  Assessment/Plan  Diabetes: -Currently uncontrolled based on recent A1c, but home BG reflects improved control -Informed patient that variances in Dexcom GMI and A1c could be due to red blood cell turnover, and A1c can reflect up to 4 months of information- I expect to see continued improvement in A1c based on Dexcom data -Recommend continuing glipizide  XL 10mg  daily, Tresiba  25 units daily, Humalog 12-20 units with largest meal of the day, and Mounjaro  5mg  weekly (filled 84d supply 9/4) -Goal to titrate Mounjaro  dose to be able to decrease insulin  demand -Advised patient that eating consistent meals throughout the day with a good balance of lean protein, complex carbohydrates, and fiber will help stabilize blood glucose- patient would likely benefit from using Novolog  consistently but at a lower dose, such as 5 units TID before meals- will discuss at next visit -Contacting Dexcom to request replacement sensors for patient -PCP again in December and will be due for A1c  Hypertension: -Currently moderately controlled -Continue current regimen -Recommend monitoring/recording home BP at least 3x/week   Hyperlipidemia/ASCVD Risk Reduction: -Currently controlled based on LDL<70; however, TG has been quite elevated -Continue current regimen -Order for lipids has been placed by PCP  Follow Up Plan: 6 weeks   Channing DELENA Mealing, PharmD, DPLA

## 2024-03-20 ENCOUNTER — Ambulatory Visit (INDEPENDENT_AMBULATORY_CARE_PROVIDER_SITE_OTHER): Admitting: Internal Medicine

## 2024-03-20 ENCOUNTER — Encounter (HOSPITAL_BASED_OUTPATIENT_CLINIC_OR_DEPARTMENT_OTHER): Payer: Self-pay | Admitting: Internal Medicine

## 2024-03-20 VITALS — BP 134/82 | HR 89 | Ht 66.0 in | Wt 197.3 lb

## 2024-03-20 DIAGNOSIS — E781 Pure hyperglyceridemia: Secondary | ICD-10-CM

## 2024-03-20 DIAGNOSIS — Z8673 Personal history of transient ischemic attack (TIA), and cerebral infarction without residual deficits: Secondary | ICD-10-CM | POA: Diagnosis not present

## 2024-03-20 DIAGNOSIS — E785 Hyperlipidemia, unspecified: Secondary | ICD-10-CM

## 2024-03-20 DIAGNOSIS — E119 Type 2 diabetes mellitus without complications: Secondary | ICD-10-CM | POA: Diagnosis not present

## 2024-03-20 NOTE — Patient Instructions (Signed)
 Medication Instructions:  No changes *If you need a refill on your cardiac medications before your next appointment, please call your pharmacy*  Lab Work: Today: blood work as directed. Lipid panel Direct LDL Lp(a) Lipase     Follow-Up: Dr. Mona will reach out with results and further recommendations

## 2024-03-20 NOTE — Progress Notes (Signed)
 LIPID CLINIC CONSULT NOTE  Chief Complaint:  Manage dyslipidemia  Primary Care Physician: Nedra Tinnie LABOR, NP  Primary Cardiologist:  None  HPI:  Sara Banks is a 63 y.o. female who is being seen today for the evaluation of dyslipidemia at the request of McElwee, Lauren A, NP.  This a pleasant 63 year old female kindly referred for evaluation management of dyslipidemia.  She had recent lipid testing in September which showed a total cholesterol of 158, triglycerides 180, HDL 43 -LDL was not performed.  She has a history of high triglycerides historically as high as over 1400 about 3 years ago and up to 522 about 4 months ago.  She also tells me that she has some chronic pain issues including upper abdominal pain that does radiate to her back which could be pancreatitis.  She has never been hospitalized for that or diagnosed formally.  It is also unknown whether she may have some cardiovascular disease.  Other risk factors include type 2 diabetes and apparently a prior stroke.  Her goal LDL would be less than 70.  She is currently on rosuvastatin  40 mg daily and fenofibrate  200 mg daily.  Renal function is normal however the combination of these 2 medicines may increase risk of myalgias.  PMHx:  Past Medical History:  Diagnosis Date   Acute ischemic stroke of right pons 05/11/2013   Anxiety    Arthritis    Cataract    Chronic pain    Depression    Diabetes mellitus without complication (HCC) 2014   GERD (gastroesophageal reflux disease)    Hepatitis C dx 2014   [facility]   Neuropathy    Stroke (cerebrum) [facility])     Past Surgical History:  Procedure Laterality Date   BREAST BIOPSY Left 06/30/2022   US  LT BREAST BX W LOC DEV 1ST LESION IMG BX SPEC US  GUIDE 06/30/2022 [facility] MAMMOGRAPHY   KNEE ARTHROSCOPY W/ ACL RECONSTRUCTION     right knee x 3   TRANSTHORACIC ECHOCARDIOGRAM  06/14/2012   normal   TUBAL LIGATION  1985    FAMHx:  Family History  Problem  Relation Age of Onset   Cancer Mother        brain   Lung cancer Mother    Diabetes Mother    Cancer Father        unsure wich kind   Colon polyps Father    Rheum arthritis Neg Hx    Colon cancer Neg Hx    Esophageal cancer Neg Hx     SOCHx:   reports that she has been smoking cigarettes. She has a 19 pack-year smoking history. She has never used smokeless tobacco. She reports that she does not drink alcohol and does not use drugs.  ALLERGIES:  Allergies  Allergen Reactions   Aspirin  Other (See Comments)    Stomach discomfort   Jardiance  [Empagliflozin ] Other (See Comments)    Muscle and joint pain    ROS: Pertinent items noted in HPI and remainder of comprehensive ROS otherwise negative.  HOME MEDS: Current Outpatient Medications on File Prior to Visit  Medication Sig Dispense Refill   Continuous Glucose Sensor (DEXCOM G7 SENSOR) MISC Change sensor every 10 days 9 each 3   DULoxetine  (CYMBALTA ) 60 MG capsule Take 1 capsule (60 mg total) by mouth daily. 90 capsule 1   fenofibrate  micronized (LOFIBRA) 200 MG capsule Take 1 capsule (200 mg total) by mouth daily before breakfast. 90 capsule 1  gabapentin  (NEURONTIN ) 300 MG capsule TAKE 1 CAPSULE BY MOUTH THREE TIMES A DAY 270 capsule 0   glipiZIDE  (GLUCOTROL  XL) 10 MG 24 hr tablet TAKE 1 TABLET BY MOUTH EVERY DAY 90 tablet 1   ibuprofen  (ADVIL ) 600 MG tablet Take 1 tablet (600 mg total) by mouth every 6 (six) hours as needed. 30 tablet 1   insulin  aspart (NOVOLOG ) 100 UNIT/ML injection Inject 4 Units into the skin daily. Take once a day with your biggest meal (Patient taking differently: Inject 12-20 Units into the skin once. With largest meal) 10 mL PRN   insulin  degludec (TRESIBA  FLEXTOUCH) 100 UNIT/ML FlexTouch Pen Inject 25 Units into the skin daily. 9 mL 2   Insulin  Pen Needle (PEN NEEDLES) 32G X 5 MM MISC 1 Needle by Does not apply route daily. 90 each 1   losartan  (COZAAR ) 25 MG tablet TAKE 1 TABLET (25 MG TOTAL) BY MOUTH  DAILY. 90 tablet 1   omeprazole  (PRILOSEC) 40 MG capsule Take 1 capsule (40 mg total) by mouth daily. 90 capsule 1   rosuvastatin  (CRESTOR ) 40 MG tablet Take 1 tablet (40 mg total) by mouth daily. 90 tablet 3   tirzepatide  (MOUNJARO ) 5 MG/0.5ML Pen Inject 5 mg into the skin once a week. 6 mL 0   traZODone  (DESYREL ) 100 MG tablet TAKE 1 TABLET BY MOUTH EVERYDAY AT BEDTIME 90 tablet 0   VITAMIN D  PO Take by mouth daily.     No current facility-administered medications on file prior to visit.    LABS/IMAGING: No results found for this or any previous visit (from the past 48 hours). No results found.  LIPID PANEL:    Component Value Date/Time   CHOL 158 11/16/2023 0911   TRIG (H) 11/16/2023 0911    522.0 Triglyceride is over 400; calculations on Lipids are invalid.   HDL 43.20 11/16/2023 0911   CHOLHDL 4 11/16/2023 0911   VLDL 104.4 (H) 11/16/2023 0911   LDLCALC 47 08/15/2023 0941   LDLDIRECT 54.0 11/16/2023 0911    No results found for: LIPOA   WEIGHTS: Wt Readings from Last 3 Encounters:  03/20/24 197 lb 4.8 oz (89.5 kg)  02/16/24 201 lb 6.4 oz (91.4 kg)  11/16/23 200 lb (90.7 kg)    VITALS: BP 134/82   Pulse 89   Ht 5' 6 (1.676 m)   Wt 197 lb 4.8 oz (89.5 kg)   SpO2 94%   BMI 31.85 kg/m   EXAM: Deferred  EKG: Deferred  ASSESSMENT: Dyslipidemia, goal LDL less than 55 Type 2 diabetes History of stroke (2014) History of high triglycerides ?  Pancreatitis  PLAN: 1.   Ms. Skeen has a dyslipidemia and has had high triglycerides.  She is on high-dose statin and fenofibrate  with her LDL likely at goal for history of stroke and diabetes however, target LDL should be less than 55 as these are high risk findings.  Will plan a reassessment of her lipids including LP(a) and check lipase to make sure there is no ongoing pancreatitis.  I would strongly consider adding Vascepa which could further help with her triglycerides and is shown cardiovascular benefit in patients  with prior stroke or diabetes and high triglycerides.  Thanks again for the kind referral.  Vinie KYM Maxcy, MD, [facility], FNLA, FACP  [place]  [facility]  Medical Director of the Advanced Lipid Disorders &  Cardiovascular Risk Reduction Clinic Diplomate of the American Board of Clinical Lipidology Attending Cardiologist  Direct Dial: [phone]  Fax:  [phone]  Website:  [email or web address]  Vinie BROCKS Jolanta Cabeza 03/20/2024, 8:30 AM

## 2024-03-21 ENCOUNTER — Other Ambulatory Visit: Payer: Self-pay | Admitting: Nurse Practitioner

## 2024-03-21 ENCOUNTER — Ambulatory Visit: Payer: Self-pay | Admitting: Internal Medicine

## 2024-03-21 DIAGNOSIS — E1140 Type 2 diabetes mellitus with diabetic neuropathy, unspecified: Secondary | ICD-10-CM

## 2024-03-21 DIAGNOSIS — E785 Hyperlipidemia, unspecified: Secondary | ICD-10-CM

## 2024-03-21 LAB — LIPASE: Lipase: 26 U/L (ref 14–72)

## 2024-03-21 LAB — LIPID PANEL
Chol/HDL Ratio: 3 ratio (ref 0.0–4.4)
Cholesterol, Total: 172 mg/dL (ref 100–199)
HDL: 57 mg/dL (ref >39)
LDL Chol Calc (NIH): 84 mg/dL (ref 0–99)
Triglycerides: 186 mg/dL — ABNORMAL HIGH (ref 0–149)
VLDL Cholesterol Cal: 31 mg/dL (ref 5–40)

## 2024-03-21 LAB — LDL CHOLESTEROL, DIRECT: LDL Direct: 81 mg/dL (ref 0–99)

## 2024-03-21 LAB — LIPOPROTEIN A (LPA): Lipoprotein (a): 31.1 nmol/L (ref <75.0)

## 2024-03-22 ENCOUNTER — Other Ambulatory Visit: Payer: Self-pay

## 2024-03-22 NOTE — Patient Outreach (Signed)
 Complex Care Management   Visit Note  03/22/2024  Name:  Sara Banks MRN: [id number] DOB: 03-10-1961  Situation: Referral received for Complex Care Management related to Diabetes with Complications I obtained verbal consent from Patient.  Visit completed with Patient  on the phone  Background:   Past Medical History:  Diagnosis Date   Acute ischemic stroke of right pons 05/11/2013   Anxiety    Arthritis    Cataract    Chronic pain    Depression    Diabetes mellitus without complication (HCC) 2014   GERD (gastroesophageal reflux disease)    Hepatitis C dx 2014   [facility]   Neuropathy    Stroke (cerebrum) [facility])     Assessment: Sara Banks reports she continues to work full time. 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 Patient expresses ongoing back pain that is worse sometimes than others. RNCM encouraged patient to schedule appointment with PCP. Patient reports has had a long time and spoke with provider in the past. She states she will wait until her appointment with PCP in December. RNCM encouraged patient to notify provider if it worsens.  Patient Reported Symptoms:  Cognitive Cognitive Status: No symptoms reported      Neurological Neurological Review of Symptoms: No symptoms reported    HEENT HEENT Symptoms Reported: No symptoms reported      Cardiovascular Cardiovascular Symptoms Reported: No symptoms reported    Respiratory Respiratory Symptoms Reported: No symptoms reported    Endocrine Endocrine Symptoms Reported: No symptoms reported Is patient diabetic?: Yes Is patient checking blood sugars at home?: Yes List most recent blood sugar readings, include date and time of day: patient reports BS 171 tea with a little suger.    Gastrointestinal Gastrointestinal Symptoms Reported: Constipation Additional Gastrointestinal Details: patient states she has not taken stool softner, discussed increase water, fiber, patient states  will drink some prune juice      Genitourinary Genitourinary Symptoms Reported: No symptoms reported    Integumentary Integumentary Symptoms Reported: No symptoms reported    Musculoskeletal Musculoskelatal Symptoms Reviewed: No symptoms reported Additional Musculoskeletal Details: patient reports left hand/arm pain improved 3        Psychosocial Psychosocial Symptoms Reported: Anxiety - if selected complete GAD Additional Psychological Details: Patient reports anxiety-GAD score 19. Patient declines social work referral. reports her anxiety is due to where she is living. Discussed saftey/security measures: security system, dog, fence, talking with police and possiblity of relocating. Patient states she is aware and has spoken with police. Patient states she is not going to relocate. declines social work referral.          03/22/2024    PHQ2-9 Depression Screening   Little interest or pleasure in doing things Not at all  Feeling down, depressed, or hopeless Not at all  PHQ-2 - Total Score 0  Trouble falling or staying asleep, or sleeping too much    Feeling tired or having little energy    Poor appetite or overeating     Feeling bad about yourself - or that you are a failure or have let yourself or your family down    Trouble concentrating on things, such as reading the newspaper or watching television    Moving or speaking so slowly that other people could have noticed.  Or the opposite - being so fidgety or restless that you have been moving around a lot more than usual    Thoughts that you would be better off  dead, or hurting yourself in some way    PHQ2-9 Total Score    If you checked off any problems, how difficult have these problems made it for you to do your work, take care of things at home, or get along with other people    Depression Interventions/Treatment        03/22/2024    9:46 AM 02/16/2024   10:10 AM 11/16/2023    8:31 AM 08/15/2023    9:09 AM  GAD 7 : Generalized  Anxiety Score  Nervous, Anxious, on Edge 3 2 1 2   Control/stop worrying 3 3 3 3   Worry too much - different things 3 2 3 3   Trouble relaxing 3 2 3 3   Restless 3 2 2 1   Easily annoyed or irritable 1 0 3 1  Afraid - awful might happen 3 3 3  0  Total GAD 7 Score 19 14 18 13   Anxiety Difficulty Not difficult at all Somewhat difficult Somewhat difficult Somewhat difficult     Vitals:   03/22/24 0922  BP: (!) 143/90  Pulse: 89    Medications Reviewed Today     Reviewed by Rual Vermeer M, RN (Registered Nurse) on 03/22/24 at [phone]  Med List Status: <None>   Medication Order Taking? Sig Documenting Provider Last Dose Status Informant  Continuous Glucose Sensor (DEXCOM G7 SENSOR) MISC [id number]  Change sensor every 10 days McElwee, Lauren A, NP  Active   DULoxetine  (CYMBALTA ) 60 MG capsule [id number] Yes Take 1 capsule (60 mg total) by mouth daily. McElwee, Lauren A, NP  Active   fenofibrate  micronized (LOFIBRA) 200 MG capsule [id number] Yes Take 1 capsule (200 mg total) by mouth daily before breakfast. McElwee, Lauren A, NP  Active   gabapentin  (NEURONTIN ) 300 MG capsule [id number] Yes TAKE 1 CAPSULE BY MOUTH THREE TIMES A DAY McElwee, Lauren A, NP  Active   glipiZIDE  (GLUCOTROL  XL) 10 MG 24 hr tablet [id number] Yes TAKE 1 TABLET BY MOUTH EVERY DAY McElwee, Lauren A, NP  Active   ibuprofen  (ADVIL ) 600 MG tablet [id number] Yes Take 1 tablet (600 mg total) by mouth every 6 (six) hours as needed. McElwee, Lauren A, NP  Active   insulin  aspart (NOVOLOG ) 100 UNIT/ML injection [id number] Yes Inject 4 Units into the skin daily. Take once a day with your biggest meal McElwee, Lauren A, NP  Active   insulin  degludec (TRESIBA  FLEXTOUCH) 100 UNIT/ML FlexTouch Pen [id number] Yes Inject 25 Units into the skin daily. McElwee, Lauren A, NP  Active   Insulin  Pen Needle (PEN NEEDLES) 32G X 5 MM MISC [id number]  1 Needle by Does not apply route daily. Webb, Padonda B, FNP  Active   losartan  (COZAAR ) 25 MG tablet  [id number] Yes TAKE 1 TABLET (25 MG TOTAL) BY MOUTH DAILY. McElwee, Lauren A, NP  Active   omeprazole  (PRILOSEC) 40 MG capsule [id number] Yes Take 1 capsule (40 mg total) by mouth daily. McElwee, Lauren A, NP  Active   rosuvastatin  (CRESTOR ) 40 MG tablet [id number] Yes Take 1 tablet (40 mg total) by mouth daily. McElwee, Lauren A, NP  Active   tirzepatide  (MOUNJARO ) 5 MG/0.5ML Pen [id number] Yes Inject 5 mg into the skin once a week. McElwee, Lauren A, NP  Active   traZODone  (DESYREL ) 100 MG tablet [id number] Yes TAKE 1 TABLET BY MOUTH EVERYDAY AT BEDTIME McElwee, Lauren A, NP  Active   VITAMIN D  PO [id number] Yes Take by mouth daily. [provider]  Active  Recommendation:   Continue Current Plan of Care  Follow Up Plan:   Telephone follow up appointment date/time:  05/01/24 at 9:00 am  Heddy Shutter, RN, MSN, BSN, CCM [place]  [facility], Population Health Case Manager Phone: [phone]

## 2024-03-22 NOTE — Patient Instructions (Signed)
 Visit Information  Thank you for taking time to visit with me today. Please don't hesitate to contact me if I can be of assistance to you before our next scheduled appointment.  Your next care management appointment is by telephone on at 05/01/24 at 9:00 am  Please call the care guide team at [phone] if you need to cancel, schedule, or reschedule an appointment.   Please call the Suicide and Crisis Lifeline: 988 call the [place]  National Suicide Prevention Lifeline: 864-534-7480 or TTY: (772)141-7024 TTY 908-112-0480) to talk to a trained counselor if you are experiencing a Mental Health or Behavioral Health Crisis or need someone to talk to.   Heddy Shutter, RN, MSN, BSN, CCM [place]  [facility], Population Health Case Manager Phone: [phone]

## 2024-03-23 NOTE — Telephone Encounter (Signed)
 Requesting: TRAZODONE  100 MG TABLET,  GABAPENTIN  300 MG CAPSULE , MOUNJARO  2.5 MG/0.5 ML PEN  Last Visit: 02/16/2024 Next Visit: 05/17/2024 Last Refill: 11/18/23, 11/04/23, 02/16/24  Please Advise

## 2024-03-26 ENCOUNTER — Other Ambulatory Visit: Payer: Self-pay | Admitting: Nurse Practitioner

## 2024-03-26 MED ORDER — EZETIMIBE 10 MG PO TABS: 10.0000 mg | ORAL_TABLET | Freq: Every day | ORAL | 3 refills | Status: DC

## 2024-03-26 MED ORDER — TIRZEPATIDE 5 MG/0.5ML ~~LOC~~ SOAJ: 5.0000 mg | SUBCUTANEOUS | 0 refills | Status: DC

## 2024-03-26 NOTE — Telephone Encounter (Signed)
 I called and spoke with patient and she said that she takes the 5mg .

## 2024-04-11 ENCOUNTER — Other Ambulatory Visit: Payer: Self-pay | Admitting: Medical Genetics

## 2024-04-11 DIAGNOSIS — Z006 Encounter for examination for normal comparison and control in clinical research program: Secondary | ICD-10-CM

## 2024-04-12 ENCOUNTER — Ambulatory Visit: Admission: EM | Admit: 2024-04-12 | Discharge: 2024-04-12 | Disposition: A | Discharge status: Home / Self Care

## 2024-04-12 ENCOUNTER — Ambulatory Visit: Payer: Self-pay

## 2024-04-12 ENCOUNTER — Encounter: Payer: Self-pay | Admitting: Emergency Medicine

## 2024-04-12 ENCOUNTER — Encounter: Payer: Self-pay | Admitting: Nurse Practitioner

## 2024-04-12 DIAGNOSIS — I679 Cerebrovascular disease, unspecified: Secondary | ICD-10-CM | POA: Insufficient documentation

## 2024-04-12 DIAGNOSIS — F329 Major depressive disorder, single episode, unspecified: Secondary | ICD-10-CM | POA: Insufficient documentation

## 2024-04-12 DIAGNOSIS — E1142 Type 2 diabetes mellitus with diabetic polyneuropathy: Secondary | ICD-10-CM | POA: Insufficient documentation

## 2024-04-12 DIAGNOSIS — I10 Essential (primary) hypertension: Secondary | ICD-10-CM | POA: Insufficient documentation

## 2024-04-12 DIAGNOSIS — M7918 Myalgia, other site: Secondary | ICD-10-CM | POA: Diagnosis not present

## 2024-04-12 DIAGNOSIS — E1165 Type 2 diabetes mellitus with hyperglycemia: Secondary | ICD-10-CM | POA: Insufficient documentation

## 2024-04-12 DIAGNOSIS — E039 Hypothyroidism, unspecified: Secondary | ICD-10-CM | POA: Insufficient documentation

## 2024-04-12 DIAGNOSIS — J4489 Other specified chronic obstructive pulmonary disease: Secondary | ICD-10-CM | POA: Insufficient documentation

## 2024-04-12 DIAGNOSIS — N200 Calculus of kidney: Secondary | ICD-10-CM | POA: Insufficient documentation

## 2024-04-12 MED ORDER — BACLOFEN 10 MG PO TABS: 10.0000 mg | ORAL_TABLET | Freq: Three times a day (TID) | ORAL | 0 refills | Status: DC

## 2024-04-12 MED ORDER — KETOROLAC TROMETHAMINE 60 MG/2ML IM SOLN
60.0000 mg | Freq: Once | INTRAMUSCULAR | Status: AC
  Administered 2024-04-12: 60 mg via INTRAMUSCULAR

## 2024-04-12 MED ORDER — DICLOFENAC SODIUM 75 MG PO TBEC: 75.0000 mg | DELAYED_RELEASE_TABLET | Freq: Two times a day (BID) | ORAL | 0 refills | Status: DC

## 2024-04-12 NOTE — Telephone Encounter (Signed)
 I called and spoke with patient she is complaining on upper back pain with pain in arms and also has a cough. I asked patient if I could make her an appointment and she wanted to wait to see how her work schedule for tomorrow is before making an appointment. I asked patient if I could get her over to speak with nurse triage and she agreed.

## 2024-04-12 NOTE — Discharge Instructions (Signed)
  1. Musculoskeletal pain (Primary) - ketorolac  (TORADOL ) injection 60 mg given in UC for acute musculoskeletal pain - diclofenac (VOLTAREN) 75 MG EC tablet; Take 1 tablet (75 mg total) by mouth 2 (two) times daily.  Dispense: 30 tablet; Refill: 0 - baclofen (LIORESAL) 10 MG tablet; Take 1 tablet (10 mg total) by mouth 3 (three) times daily.  Dispense: 30 each; Refill: 0 -Continue to monitor symptoms for any change in severity if there is any escalation of current symptoms or development of new symptoms follow-up in ER for further evaluation and management.

## 2024-04-12 NOTE — ED Provider Notes (Signed)
 [facility]  Note:  This document was prepared using Conservation officer, historic buildings and may include unintentional dictation errors.  MRN: [id number] DOB: 11-14-1960  Subjective:   Sara Banks is a 63 y.o. female presenting for bilateral shoulder, mid back and lower back pain x 2 to 3 weeks.  Patient reports that over the last few days symptoms have worsened.  Patient has been taking gabapentin  and ibuprofen  with mild improvement to symptoms.  Patient denies any dysuria, increased urinary frequency, abdominal pain.  Patient reports that she has increased pain with lying down and difficulty sleeping.  Denies any recent injury or trauma to the back.  No past history of lumbar disc dysfunction.  Patient reached out to her primary care provider who advised her to come in for evaluation urgent care.  No current facility-administered medications for this encounter.  Current Outpatient Medications:    baclofen (LIORESAL) 10 MG tablet, Take 1 tablet (10 mg total) by mouth 3 (three) times daily., Disp: 30 each, Rfl: 0   Continuous Glucose Sensor (DEXCOM G7 SENSOR) MISC, Change sensor every 10 days, Disp: 9 each, Rfl: 3   diclofenac (VOLTAREN) 75 MG EC tablet, Take 1 tablet (75 mg total) by mouth 2 (two) times daily., Disp: 30 tablet, Rfl: 0   DULoxetine  (CYMBALTA ) 60 MG capsule, Take 1 capsule (60 mg total) by mouth daily., Disp: 90 capsule, Rfl: 1   ezetimibe  (ZETIA ) 10 MG tablet, Take 1 tablet (10 mg total) by mouth daily., Disp: 90 tablet, Rfl: 3   fenofibrate  micronized (LOFIBRA) 200 MG capsule, Take 1 capsule (200 mg total) by mouth daily before breakfast., Disp: 90 capsule, Rfl: 1   gabapentin  (NEURONTIN ) 300 MG capsule, TAKE 1 CAPSULE BY MOUTH THREE TIMES A DAY, Disp: 270 capsule, Rfl: 0   glipiZIDE  (GLUCOTROL  XL) 10 MG 24 hr tablet, TAKE 1 TABLET BY MOUTH EVERY DAY, Disp: 90 tablet, Rfl: 1   ibuprofen  (ADVIL ) 600 MG tablet, Take 1 tablet (600 mg total) by mouth every 6 (six)  hours as needed., Disp: 30 tablet, Rfl: 1   insulin  aspart (NOVOLOG ) 100 UNIT/ML injection, Inject 4 Units into the skin daily. Take once a day with your biggest meal, Disp: 10 mL, Rfl: PRN   insulin  degludec (TRESIBA  FLEXTOUCH) 100 UNIT/ML FlexTouch Pen, Inject 25 Units into the skin daily., Disp: 9 mL, Rfl: 2   Insulin  Pen Needle (PEN NEEDLES) 32G X 5 MM MISC, 1 Needle by Does not apply route daily., Disp: 90 each, Rfl: 1   losartan  (COZAAR ) 25 MG tablet, TAKE 1 TABLET (25 MG TOTAL) BY MOUTH DAILY., Disp: 90 tablet, Rfl: 1   omeprazole  (PRILOSEC) 40 MG capsule, Take 1 capsule (40 mg total) by mouth daily., Disp: 90 capsule, Rfl: 1   rosuvastatin  (CRESTOR ) 40 MG tablet, Take 1 tablet (40 mg total) by mouth daily., Disp: 90 tablet, Rfl: 3   tirzepatide  (MOUNJARO ) 5 MG/0.5ML Pen, Inject 5 mg into the skin once a week., Disp: 6 mL, Rfl: 0   traZODone  (DESYREL ) 100 MG tablet, TAKE 1 TABLET BY MOUTH EVERYDAY AT BEDTIME, Disp: 90 tablet, Rfl: 0   VITAMIN D  PO, Take by mouth daily., Disp: , Rfl:    Allergies  Allergen Reactions   Aspirin  Other (See Comments)    Stomach discomfort   Jardiance  [Empagliflozin ] Other (See Comments)    Muscle and joint pain    Past Medical History:  Diagnosis Date   Acute ischemic stroke of right pons 05/11/2013   Anxiety  Arthritis    Cataract    Chronic pain    Depression    Diabetes mellitus without complication (HCC) 2014   GERD (gastroesophageal reflux disease)    Hepatitis C dx 2014   [facility]   Neuropathy    Stroke (cerebrum) [facility])      Past Surgical History:  Procedure Laterality Date   BREAST BIOPSY Left 06/30/2022   US  LT BREAST BX W LOC DEV 1ST LESION IMG BX SPEC US  GUIDE 06/30/2022 [facility]   KNEE ARTHROSCOPY W/ ACL RECONSTRUCTION     right knee x 3   TRANSTHORACIC ECHOCARDIOGRAM  06/14/2012   normal   TUBAL LIGATION  1985    Family History  Problem Relation Age of Onset   Cancer Mother        brain   Lung  cancer Mother    Diabetes Mother    Cancer Father        unsure wich kind   Colon polyps Father    Rheum arthritis Neg Hx    Colon cancer Neg Hx    Esophageal cancer Neg Hx     Social History   Tobacco Use   Smoking status: Every Day    Current packs/day: 0.50    Average packs/day: 0.5 packs/day for 38.0 years (19.0 ttl pk-yrs)    Types: Cigarettes   Smokeless tobacco: Never  Vaping Use   Vaping status: Never Used  Substance Use Topics   Alcohol use: No    Comment: rarely once a year wine   Drug use: No    ROS Refer to HPI for ROS details.  Objective:   Vitals: BP 95/62 (BP Location: Left Arm)   Pulse 96   Temp 98.6 F (37 C) (Oral)   Resp 18   Ht 5' 6 (1.676 m)   Wt 198 lb (89.8 kg)   SpO2 96%   BMI 31.96 kg/m   Physical Exam Vitals and nursing note reviewed.  Constitutional:      General: She is not in acute distress.    Appearance: Normal appearance. She is well-developed. She is not ill-appearing or toxic-appearing.  HENT:     Head: Normocephalic and atraumatic.  Cardiovascular:     Rate and Rhythm: Normal rate.  Pulmonary:     Effort: Pulmonary effort is normal. No respiratory distress.  Musculoskeletal:     Right shoulder: Swelling and tenderness present. No bony tenderness. Normal range of motion.     Left shoulder: Swelling and tenderness present. No bony tenderness. Normal range of motion.     Thoracic back: Spasms and tenderness present. No swelling, deformity or bony tenderness. Decreased range of motion.     Lumbar back: Spasms and tenderness present. No bony tenderness. Decreased range of motion. Negative right straight leg raise test and negative left straight leg raise test.  Skin:    General: Skin is warm and dry.  Neurological:     General: No focal deficit present.     Mental Status: She is alert and oriented to person, place, and time.  Psychiatric:        Mood and Affect: Mood normal.        Behavior: Behavior normal.      Procedures  No results found for this or any previous visit (from the past 24 hours).  No results found.   Assessment and Plan :     Discharge Instructions       1. Musculoskeletal pain (Primary) - ketorolac  (  TORADOL ) injection 60 mg given in UC for acute musculoskeletal pain - diclofenac (VOLTAREN) 75 MG EC tablet; Take 1 tablet (75 mg total) by mouth 2 (two) times daily.  Dispense: 30 tablet; Refill: 0 - baclofen (LIORESAL) 10 MG tablet; Take 1 tablet (10 mg total) by mouth 3 (three) times daily.  Dispense: 30 each; Refill: 0 -Continue to monitor symptoms for any change in severity if there is any escalation of current symptoms or development of new symptoms follow-up in ER for further evaluation and management.       Stevens Magwood B Najae Filsaime   Malacki Mcphearson, [place] B, [place] 04/12/24 2003

## 2024-04-12 NOTE — ED Triage Notes (Signed)
 Patient c/o back pain between both shoulder blades and radiates down to lower back x 3 weeks.  Sx's worsened over the last couple of days.  Difficulty sleeping and lying down, denies any urinary sx's.  Patient has taken Gabapentin  and Ibuprofen .

## 2024-04-12 NOTE — Telephone Encounter (Signed)
 FYI Only or Action Required?: FYI only for provider: ED advised and pt refused- willing to go to UC instead.  Patient was last seen in primary care on 02/16/2024 by Nedra Tinnie LABOR, NP.  Called Nurse Triage reporting Back Pain.  Symptoms began several days ago.  Interventions attempted: Prescription medications: Gabapentin , ibuprofen .  Symptoms are: gradually worsening.  Triage Disposition: Go to ED Now (Notify PCP)  Patient/caregiver understands and will follow disposition?: Yes Reason for Disposition  [1] Age > 40 AND [2] no obvious cause AND [3] pain even when not moving the arm  (Exception: Pain is clearly made worse by moving arm or bending neck.)  Answer Assessment - Initial Assessment Questions Received call from [facility] to triage patient:  Excruciating pain Across her shoulders radiating down her right side to her hip. Right kidney. Constant pain. Shoulders are making it so she cannot sleep.  Cannot tell if its muscular or visceral  Cough and Chills- cannot take deep breaths without coughing.Waking in the middle of the night drenched in sweat  Occasionally productive cough, white phelgm thick.  Gabapentin /ibuprofen - 2 ibuprofen  and 2 Gaba in the last 3 hours-  Denies urinary symptoms- might have a yeast infection bc of all the meds she is on. No longer itching. Heating pad and Vicks- denies helping.  Back massager helped some but came right back.   Patient advised to go in to ED for evaluation of symptoms to rule out cardiac causation. Patient reluctant to go in, denying feeling like cardiac is the cause. Advised at the very least she needs to be evaluated in UC. Closest UC given and wait time currently very low. Patient will get dressed and get evaluated. If they advise ED, she will go to ED.    1. ONSET: When did the pain start?     For a couple weeks but getting worse the last 2 days.  2. LOCATION: Where is the pain located?     Across her shoulders radiating  down her right side to her hip. Right kidney.  3. PAIN: How bad is the pain? (Scale 1-10; or mild, moderate, severe)     excruciating 4. WORK OR EXERCISE: Has there been any recent work or exercise that involved this part of the body?     Doesn't recall anything that would have irritated 5. CAUSE: What do you think is causing the shoulder pain?     unsure 6. OTHER SYMPTOMS: Do you have any other symptoms? (e.g., neck pain, swelling, rash, fever, numbness, weakness)     Difficulty sleeping, cough, night sweats  Protocols used: Shoulder Pain-A-AH

## 2024-04-13 NOTE — Telephone Encounter (Signed)
 Noted - she went to urgent care after talking to triage

## 2024-04-13 NOTE — Telephone Encounter (Signed)
 Noted

## 2024-04-16 ENCOUNTER — Ambulatory Visit: Payer: Self-pay

## 2024-04-16 ENCOUNTER — Encounter: Payer: Self-pay | Admitting: Radiology

## 2024-04-16 NOTE — Telephone Encounter (Signed)
 FYI Only or Action Required?: Action required by provider: request for chest xray.  Patient was last seen in primary care on 02/16/2024 by Nedra Tinnie LABOR, NP.  Called Nurse Triage reporting Cough.  Symptoms began several weeks ago.  Interventions attempted: OTC medications: cough.  Symptoms are: unchanged.  Triage Disposition: See PCP When Office is Open (Within 3 Days)  Patient/caregiver understands and will follow disposition?: No, wishes to speak with PCP   Copied from CRM #[id number]. Topic: Clinical - Red Word Triage >> Apr 16, 2024 11:55 AM Ashley SAUNDERS wrote: Red Word that prompted transfer to Nurse Triage: Triaged 10/30, sweating/fever, suspected pneumonia, not addressing root cause just symptoms. Reason for Disposition  Cough has been present for > 3 weeks  Answer Assessment - Initial Assessment Questions Patient called and says she was seen at the UC on 04/12/24 and nothing was done regarding an xray to check for pneumonia. She says all of the pain medication being given for her back is not helping what is going on. She says she's been coughing since 04/02/24, coughing up different colored phlegm (brownish, greenish, white foamy), chest is painful due to the coughing, coughing every 15 minutes or so, fever, night sweats, and weight loss due to unable to eat, loss from 201-185 lbs since September. She says she needs to have a chest x-ray done. Advised no availability today in the office, so may need to go to the ED to have that xray done. She says that is only for emergencies and she doesn't feel like she's at the point to go to the ED, she can come in tomorrow. Scheduled with 2 different providers, but once I told her it's up to the provider as to the treatment plan regarding ordering a chest xray in office she asked me to cancel because she only wants a chest xray done to check for pneumonia. Advised I will send this to her PCP and someone will call with her recommendation. She agreed to  this.    1. ONSET: When did the cough begin?      04/02/24  2. SEVERITY: How bad is the cough today?      Every 15 minutes or so  3. SPUTUM: Describe the color of your sputum (e.g., none, dry cough; clear, white, yellow, green)     Brownish green, white, foamy  4. HEMOPTYSIS: Are you coughing up any blood? If Yes, ask: How much? (e.g., flecks, streaks, tablespoons, etc.)     No  5. DIFFICULTY BREATHING: Are you having difficulty breathing? If Yes, ask: How bad is it? (e.g., mild, moderate, severe)      Moderate-severe, short shallow breaths, no deep breaths  6. FEVER: Do you have a fever? If Yes, ask: What is your temperature, how was it measured, and when did it start?     Yes, unable to determine due to no thermometer    7. OTHER SYMPTOMS: Do you have any other symptoms? (e.g., runny nose, wheezing, chest pain)       Chest pain with coughing  Protocols used: Cough - Acute Productive-A-AH

## 2024-04-16 NOTE — Telephone Encounter (Signed)
 Left message for patient to return call.

## 2024-04-17 ENCOUNTER — Ambulatory Visit: Payer: Self-pay | Admitting: Nurse Practitioner

## 2024-04-17 ENCOUNTER — Ambulatory Visit: Admitting: Nurse Practitioner

## 2024-04-17 ENCOUNTER — Ambulatory Visit (INDEPENDENT_AMBULATORY_CARE_PROVIDER_SITE_OTHER)
Admission: RE | Admit: 2024-04-17 | Discharge: 2024-04-17 | Disposition: A | Discharge status: Home / Self Care | Source: Ambulatory Visit | Attending: Nurse Practitioner | Admitting: Nurse Practitioner

## 2024-04-17 ENCOUNTER — Ambulatory Visit: Admitting: Internal Medicine

## 2024-04-17 ENCOUNTER — Ambulatory Visit (INDEPENDENT_AMBULATORY_CARE_PROVIDER_SITE_OTHER): Admitting: Nurse Practitioner

## 2024-04-17 VITALS — BP 132/74 | HR 76 | Temp 97.8°F | Ht 66.0 in | Wt 196.4 lb

## 2024-04-17 DIAGNOSIS — R051 Acute cough: Secondary | ICD-10-CM | POA: Diagnosis not present

## 2024-04-17 DIAGNOSIS — J069 Acute upper respiratory infection, unspecified: Secondary | ICD-10-CM

## 2024-04-17 DIAGNOSIS — J4489 Other specified chronic obstructive pulmonary disease: Secondary | ICD-10-CM

## 2024-04-17 LAB — POCT INFLUENZA A/B
Influenza A, POC: NEGATIVE
Influenza B, POC: NEGATIVE

## 2024-04-17 LAB — POC COVID19 BINAXNOW: SARS Coronavirus 2 Ag: NEGATIVE

## 2024-04-17 MED ORDER — IPRATROPIUM-ALBUTEROL 0.5-2.5 (3) MG/3ML IN SOLN: 3.0000 mL | Freq: Once | RESPIRATORY_TRACT | Status: AC

## 2024-04-17 MED ORDER — BENZONATATE 200 MG PO CAPS: 200.0000 mg | ORAL_CAPSULE | Freq: Three times a day (TID) | ORAL | 0 refills | Status: DC | PRN

## 2024-04-17 MED ORDER — DM-GUAIFENESIN ER 30-600 MG PO TB12: 1.0000 | ORAL_TABLET | Freq: Two times a day (BID) | ORAL | Status: DC | PRN

## 2024-04-17 MED ORDER — PREDNISONE 20 MG PO TABS: 20.0000 mg | ORAL_TABLET | Freq: Every day | ORAL | 0 refills | Status: DC

## 2024-04-17 MED ORDER — FLUTICASONE FUROATE-VILANTEROL 200-25 MCG/ACT IN AEPB: 1.0000 | INHALATION_SPRAY | Freq: Every day | RESPIRATORY_TRACT | 2 refills | Status: AC

## 2024-04-17 MED ORDER — ALBUTEROL SULFATE HFA 108 (90 BASE) MCG/ACT IN AERS: 2.0000 | INHALATION_SPRAY | Freq: Four times a day (QID) | RESPIRATORY_TRACT | 0 refills | Status: AC | PRN

## 2024-04-17 NOTE — Telephone Encounter (Signed)
 Patient was seen in office today by Roselie Mood, NP.

## 2024-04-17 NOTE — Progress Notes (Signed)
 Acute Office Visit  Subjective:    Patient ID: Sara Banks, female    DOB: 1960/10/26, 63 y.o.   MRN: [id number]  Chief Complaint  Patient presents with   Cough    Started 04/08/24 Cough, fatigue, diarrhea first 3 days, back ache, clear foamy phlegm, no energy, wheezing SOB worse at night, loss of appetite no taste or smell    Knee Pain   Cough This is a new problem. The current episode started in the past 7 days. The problem has been gradually worsening. The problem occurs constantly. The cough is Productive of sputum. Associated symptoms include nasal congestion, postnasal drip, shortness of breath and wheezing. Pertinent negatives include no chest pain, chills, ear congestion, ear pain, fever, headaches, heartburn, hemoptysis, myalgias, rash, rhinorrhea, sore throat, sweats or weight loss. Associated symptoms comments: Diarrhea has resolved. The symptoms are aggravated by lying down. She has tried OTC cough suppressant for the symptoms. The treatment provided no relief.  She want flu and covid test completed despite being outside of testing window.  Outpatient Medications Prior to Visit  Medication Sig   baclofen (LIORESAL) 10 MG tablet Take 1 tablet (10 mg total) by mouth 3 (three) times daily.   Continuous Glucose Sensor (DEXCOM G7 SENSOR) MISC Change sensor every 10 days   diclofenac (VOLTAREN) 75 MG EC tablet Take 1 tablet (75 mg total) by mouth 2 (two) times daily.   DULoxetine  (CYMBALTA ) 60 MG capsule Take 1 capsule (60 mg total) by mouth daily.   ezetimibe  (ZETIA ) 10 MG tablet Take 1 tablet (10 mg total) by mouth daily.   fenofibrate  micronized (LOFIBRA) 200 MG capsule Take 1 capsule (200 mg total) by mouth daily before breakfast.   gabapentin  (NEURONTIN ) 300 MG capsule TAKE 1 CAPSULE BY MOUTH THREE TIMES A DAY   glipiZIDE  (GLUCOTROL  XL) 10 MG 24 hr tablet TAKE 1 TABLET BY MOUTH EVERY DAY   ibuprofen  (ADVIL ) 600 MG tablet Take 1 tablet (600 mg total) by mouth every 6 (six)  hours as needed.   insulin  aspart (NOVOLOG ) 100 UNIT/ML injection Inject 4 Units into the skin daily. Take once a day with your biggest meal   insulin  degludec (TRESIBA  FLEXTOUCH) 100 UNIT/ML FlexTouch Pen Inject 25 Units into the skin daily.   Insulin  Pen Needle (PEN NEEDLES) 32G X 5 MM MISC 1 Needle by Does not apply route daily.   losartan  (COZAAR ) 25 MG tablet TAKE 1 TABLET (25 MG TOTAL) BY MOUTH DAILY.   omeprazole  (PRILOSEC) 40 MG capsule Take 1 capsule (40 mg total) by mouth daily.   rosuvastatin  (CRESTOR ) 40 MG tablet Take 1 tablet (40 mg total) by mouth daily.   tirzepatide  (MOUNJARO ) 5 MG/0.5ML Pen Inject 5 mg into the skin once a week.   traZODone  (DESYREL ) 100 MG tablet TAKE 1 TABLET BY MOUTH EVERYDAY AT BEDTIME   VITAMIN D  PO Take by mouth daily.   No facility-administered medications prior to visit.   Reviewed past medical and social history.  Review of Systems  Constitutional:  Negative for chills, fever and weight loss.  HENT:  Positive for postnasal drip. Negative for ear pain, rhinorrhea and sore throat.   Respiratory:  Positive for cough, shortness of breath and wheezing. Negative for hemoptysis.   Cardiovascular:  Negative for chest pain.  Gastrointestinal:  Negative for heartburn.  Musculoskeletal:  Negative for myalgias.  Skin:  Negative for rash.  Neurological:  Negative for headaches.   Per HPI     Objective:  Physical Exam Vitals and nursing note reviewed.  Cardiovascular:     Rate and Rhythm: Normal rate.     Pulses: Normal pulses.     Heart sounds: Normal heart sounds.  Pulmonary:     Effort: Pulmonary effort is normal.     Breath sounds: Wheezing and rales present.     Comments: Persistent wheezing and rales post nebulizer treatement Neurological:     Mental Status: She is alert and oriented to person, place, and time.    BP 132/74 (BP Location: Left Arm, Patient Position: Sitting, Cuff Size: Large)   Pulse 76   Temp 97.8 F (36.6 C)  (Temporal)   Ht 5' 6 (1.676 m)   Wt 196 lb 6.4 oz (89.1 kg)   SpO2 96%   BMI 31.70 kg/m    Results for orders placed or performed in visit on 04/17/24  POCT Influenza A/B  Result Value Ref Range   Influenza A, POC Negative Negative   Influenza B, POC Negative Negative  POC COVID-19  Result Value Ref Range   SARS Coronavirus 2 Ag Negative Negative       Assessment & Plan:   Problem List Items Addressed This Visit   None Visit Diagnoses       Viral upper respiratory tract infection    -  Primary   Relevant Medications   ipratropium-albuterol (DUONEB) 0.5-2.5 (3) MG/3ML nebulizer solution 3 mL   albuterol (VENTOLIN HFA) 108 (90 Base) MCG/ACT inhaler   benzonatate (TESSALON) 200 MG capsule   dextromethorphan-guaiFENesin (MUCINEX DM) 30-600 MG 12hr tablet   Other Relevant Orders   POCT Influenza A/B (Completed)   POC COVID-19 (Completed)   DG Chest 2 View (Completed)     Acute cough       Relevant Medications   ipratropium-albuterol (DUONEB) 0.5-2.5 (3) MG/3ML nebulizer solution 3 mL   albuterol (VENTOLIN HFA) 108 (90 Base) MCG/ACT inhaler   benzonatate (TESSALON) 200 MG capsule   dextromethorphan-guaiFENesin (MUCINEX DM) 30-600 MG 12hr tablet   Other Relevant Orders   POCT Influenza A/B (Completed)   POC COVID-19 (Completed)   DG Chest 2 View (Completed)      Meds ordered this encounter  Medications   ipratropium-albuterol (DUONEB) 0.5-2.5 (3) MG/3ML nebulizer solution 3 mL   albuterol (VENTOLIN HFA) 108 (90 Base) MCG/ACT inhaler    Sig: Inhale 2 puffs into the lungs every 6 (six) hours as needed.    Dispense:  8 g    Refill:  0    Supervising Provider:   BERNETA FALLOW ALFRED [[id number]]   benzonatate (TESSALON) 200 MG capsule    Sig: Take 1 capsule (200 mg total) by mouth 3 (three) times daily as needed.    Dispense:  21 capsule    Refill:  0    Supervising Provider:   KREMER, WILLIAM ALFRED [[id number]]   dextromethorphan-guaiFENesin (MUCINEX DM) 30-600 MG 12hr  tablet    Sig: Take 1 tablet by mouth 2 (two) times daily as needed for cough.    Supervising Provider:   BERNETA FALLOW ALFRED [[id number]]   No pneumonia noted on CXR CXR indicates chronic bronchitis.  I sent oral prednisone 20mg  daily x 3days, then start breo inhaler once a day. Rinse mouth after each use. Use albuterol for wheezing or chest tightness or shortness of breath. Also start zyrtec 10mg  daily at bedtime. Stop all oral decongestant Ok to use robitussin and benzonatate for cough Maintain adequate oral hydration F/up with pcp in 45month  Return if symptoms  worsen or fail to improve.    Roselie Mood, NP

## 2024-04-17 NOTE — Patient Instructions (Signed)
 Go to [address] for CXR Stop all oral decongestant Ok to use robitussin and benzonatate for cough Maintain adequate oral hydration

## 2024-04-23 NOTE — Progress Notes (Unsigned)
 04/24/2024  Patient ID: Sara Banks, female   DOB: Mar 10, 1961, 63 y.o.   MRN: [id number]  Subjective/Objective Patient outreach to follow-up on management of chronic conditions   Diabetes: Current medications: Tresiba  25units in the morning, Novolog  12-20 units daily with largest meal, glipizide  XL 10mg  daily, Mounjaro  5mg  weekly  -Medications tried in the past: Ozempic  caused stomach upset and patient states Trulicity  did not work well but does not recall any adverse side effects from mediation. Jardiance  caused muscle and joint pain, and Farxiga  was recently stopped due to urinary retention -Using Dexcom G7 for CGM  -States FBG averaging 140, but post-prandial will go >200 -A1c on 9/4 9.0%, down from 9.5% in June -Patient has now had 8 doses of Mounjaro  5mg  weekly and is tolerating well.  Does endorse some constipation but states this has been present since she was a child- does not seem to be related to Mounjaro .  She is currently only using OTC stool softeners to help with this.  States she had a BM this morning, but that was the first in 4 days. -Diet consisting of fresh fruits, vegetables, fish and grilled chicken.  Patient endorses drinking plenty of water. -Novolog  sliding scale: BG 151-200 injects 12 units, BG 201-250 injects 14 units, 251-300 injects 16 units, 301-350 18 units, >350 injects 18 units -Patient is only using Novolog  once daily due to typically only having 1-2 actual meals per day due to work schedule (on call); patient is not always using Novolog  as prescribed due to some hypoglycemia after use. She states blood glucose will spike after meals but comes down quickly and will go too  low when Novolog  is used sometimes.  Has had BG readings in the 40's but states this does not occur more than once a month, and Dexcom will alert her; resolved with some candy. -Patient previously followed by endocrinology with [facility] but canceled recent follow-up and has not  rescheduled- endorses specialist visits are expensive for her  Lab Results  Component Value Date   HGBA1C 9.0 (H) 02/16/2024   HGBA1C 9.5 (H) 11/16/2023   HGBA1C 10.2 06/20/2023      Hypertension: Current medications: losartan  25mg  daily -Patient does have home upper-arm, automated BP monitor; she checked her BP during our visit, and this was 128/80 -BP was 132/74 on 11/4; but she was sick, which could elevate reading   Hyperlipidemia/ASCVD Risk Reduction Current lipid lowering medications: rosuvastatin  40mg  daily, fenofibrate  200mg  daily, ezetimibe  10mg  daily  -Fenofibrate  increased  to 200mg  daily in July based on elevated TG's -Patient was seen by cardiology on 10/7; LP(a) was negative, but ezetimibe  10mg  daily was prescribed to further help with LDL and potentially TG loweing Antiplatelet regimen: none- ASA causes GI upset  Lipid Panel     Component Value Date/Time   CHOL 172 03/20/2024 0906   TRIG 186 (H) 03/20/2024 0906   HDL 57 03/20/2024 0906   CHOLHDL 3.0 03/20/2024 0906   CHOLHDL 4 11/16/2023 0911   VLDL 104.4 (H) 11/16/2023 0911   LDLCALC 84 03/20/2024 0906   LDLDIRECT 81 03/20/2024 0906   LDLDIRECT 54.0 11/16/2023 0911   LABVLDL 31 03/20/2024 0906    Chronic Bronchitis: Current medications:  Albuterol HFA rescue inhaler, Breo 200/25 1 puff daily -Patient was seen 11/4 for cough, SOB, and wheezing.  CXR noted chronic bronchitis.  Patient completed a 3 day course of prednisone 20mg  daily, then began Breo daily -Still experiencing some cough  Assessment/Plan  Diabetes: -Uncontrolled based  on A1c goal <7% -Continue current regimen at this time until 12/4 PCP follow-up -Patient will be due for A1c at PCP follow-up.  I recommend increasing Mounjaro  to 7.5mg  weekly at this time.  Based on A1c, may need to adjust insulin  to prevent hypoglycemia.  Goal to increase Mounjaro  gradually as able to be able to decrease insulin  need. -Continue CGM with Dexcom G7 -I recommend  using Miralax daily as needed, as this- along with increased water intake- will likely provide more benefit than stool softener alone for constipation.  Patient endorses concern with cost, but insurance will sometimes cover.  Order pending for PCP to sign if in agreement.  Advised patient that if insurance does not cover, and this is not affordable, she does not have to get as prescription.  Hypertension: -Currently moderately controlled -Continue current regimen -Recommend monitoring/recording home BP at least 3x/week and bring readings to upcoming PCP visit -If consistently >130/80, consider increasing losartan  to 50mg  daily    Hyperlipidemia/ASCVD Risk Reduction: -Moderately controlled; expect further improvement with ezetimibe  10mg  now on board -Continue current regimen -I do not see a cardiology follow-up scheduled at this time, but I recommend CMP and lipid panel again another 4-8 weeks  Chronic Bronchitis: -Appropriately managed with improved control -Continue current regimen at this time  Follow Up Plan: 2 months   Sara Banks, PharmD, DPLA

## 2024-04-24 ENCOUNTER — Other Ambulatory Visit: Payer: Self-pay

## 2024-04-24 DIAGNOSIS — E782 Mixed hyperlipidemia: Secondary | ICD-10-CM

## 2024-04-24 DIAGNOSIS — I10 Essential (primary) hypertension: Secondary | ICD-10-CM

## 2024-04-24 DIAGNOSIS — J4489 Other specified chronic obstructive pulmonary disease: Secondary | ICD-10-CM

## 2024-04-24 DIAGNOSIS — E1140 Type 2 diabetes mellitus with diabetic neuropathy, unspecified: Secondary | ICD-10-CM

## 2024-04-24 MED ORDER — POLYETHYLENE GLYCOL 3350 17 GM/SCOOP PO POWD: 17.0000 g | Freq: Every day | ORAL | 1 refills | Status: AC | PRN

## 2024-04-27 ENCOUNTER — Ambulatory Visit: Admitting: Physician Assistant

## 2024-05-01 ENCOUNTER — Telehealth

## 2024-05-01 ENCOUNTER — Other Ambulatory Visit: Payer: Self-pay

## 2024-05-01 NOTE — Patient Outreach (Signed)
 Complex Care Management   Visit Note  05/01/2024  Name:  Sara Banks MRN: [id number] DOB: Sep 06, 1960  Situation: Referral received for Complex Care Management related to DM and HLD I obtained verbal consent from Patient.  Visit completed with Patient  on the phone  Background:   Past Medical History:  Diagnosis Date   Acute ischemic stroke of right pons 05/11/2013   Anxiety    Arthritis    Cataract    Chronic pain    Depression    Diabetes mellitus without complication (HCC) 2014   GERD (gastroesophageal reflux disease)    Hepatitis C dx 2014   [facility]   Neuropathy    Stroke (cerebrum) (HCC)     Assessment: Patient Reported Symptoms:  Cognitive Cognitive Status: No symptoms reported      Neurological Neurological Review of Symptoms: No symptoms reported    HEENT HEENT Symptoms Reported: No symptoms reported      Cardiovascular Cardiovascular Symptoms Reported: No symptoms reported Does patient have uncontrolled Hypertension?: No Cardiovascular Management Strategies: Medication therapy, Routine screening  Respiratory Respiratory Symptoms Reported: Dry cough Other Respiratory Symptoms: reports treated for upper respiratory and was coughing a lot: inhalers, pain medicine, helped clear it up. Patient reports it is much better states she is bringing up some phlegm almost clear. Respiratory Management Strategies: Adequate rest, Routine screening, Activity  Endocrine Endocrine Symptoms Reported: No symptoms reported Is patient diabetic?: Yes Is patient checking blood sugars at home?: Yes List most recent blood sugar readings, include date and time of day: Patient reports BS has been 99 for the past 3 days.    Gastrointestinal Gastrointestinal Symptoms Reported: Constipation Additional Gastrointestinal Details: patient reports continues to have intermittent constipation      Genitourinary Genitourinary Symptoms Reported: No symptoms reported     Integumentary Integumentary Symptoms Reported: No symptoms reported    Musculoskeletal Additional Musculoskeletal Details: reports left knee   Falls in the past year?: Yes Number of falls in past year: 2 or more Was there an injury with Fall?: Yes (slipped on water on the floor while cleaning out freezer. and did a split. wearing a knee brace, but has an appointment with 04/17/24.) Fall Risk Category Calculator: 3 Patient Fall Risk Level: High Fall Risk    Psychosocial Psychosocial Symptoms Reported: No symptoms reported         There were no vitals filed for this visit. Pain Score: 6  Pain Type: Chronic pain (patient reports greater than 3 months.) Pain Location: Back Pain Orientation: Right, Upper (below shoulder blade) Pain Descriptors / Indicators: Discomfort Pain Onset: On-going  Medications Reviewed Today     Reviewed by Milli Woolridge M, RN (Registered Nurse) on 05/01/24 at 1008  Med List Status: <None>   Medication Order Taking? Sig Documenting Provider Last Dose Status Informant  albuterol (VENTOLIN HFA) 108 (90 Base) MCG/ACT inhaler [id number] Yes Inhale 2 puffs into the lungs every 6 (six) hours as needed. Nche, Roselie Rockford, NP  Active   baclofen (LIORESAL) 10 MG tablet [id number] Yes Take 1 tablet (10 mg total) by mouth 3 (three) times daily. Reddick, Johnathan B, NP  Active   benzonatate (TESSALON) 200 MG capsule [id number] Yes Take 1 capsule (200 mg total) by mouth 3 (three) times daily as needed. Katheen Roselie Rockford, NP  Active   Continuous Glucose Sensor (DEXCOM G7 SENSOR) MISC [id number]  Change sensor every 10 days McElwee, Lauren A, NP  Active   dextromethorphan-guaiFENesin (MUCINEX DM) 30-600 MG 12hr  tablet [id number] Yes Take 1 tablet by mouth 2 (two) times daily as needed for cough. Nche, Roselie Rockford, NP  Active   diclofenac (VOLTAREN) 75 MG EC tablet [id number] Yes Take 1 tablet (75 mg total) by mouth 2 (two) times daily.  Patient taking differently: Take 1  tablet (75 mg total) by mouth 2 (two) times daily.   Reddick, Johnathan B, NP  Active   DULoxetine  (CYMBALTA ) 60 MG capsule [id number] Yes Take 1 capsule (60 mg total) by mouth daily. McElwee, Lauren A, NP  Active   ezetimibe  (ZETIA ) 10 MG tablet [id number] Yes Take 1 tablet (10 mg total) by mouth daily. Mona Vinie BROCKS, MD  Active   fenofibrate  micronized (LOFIBRA) 200 MG capsule [id number] Yes Take 1 capsule (200 mg total) by mouth daily before breakfast. McElwee, Lauren A, NP  Active   fluticasone furoate-vilanterol (BREO ELLIPTA) 200-25 MCG/ACT AEPB [id number] Yes Inhale 1 puff into the lungs daily. Start after completion of oral prednisone Nche, Roselie Rockford, NP  Active   gabapentin  (NEURONTIN ) 300 MG capsule [id number] Yes TAKE 1 CAPSULE BY MOUTH THREE TIMES A DAY McElwee, Lauren A, NP  Active   glipiZIDE  (GLUCOTROL  XL) 10 MG 24 hr tablet [id number] Yes TAKE 1 TABLET BY MOUTH EVERY DAY McElwee, Lauren A, NP  Active   ibuprofen  (ADVIL ) 600 MG tablet [id number] Yes Take 1 tablet (600 mg total) by mouth every 6 (six) hours as needed. McElwee, Lauren A, NP  Active   insulin  aspart (NOVOLOG ) 100 UNIT/ML injection [id number] Yes Inject 4 Units into the skin daily. Take once a day with your biggest meal McElwee, Lauren A, NP  Active   insulin  degludec (TRESIBA  FLEXTOUCH) 100 UNIT/ML FlexTouch Pen [id number] Yes Inject 25 Units into the skin daily. McElwee, Lauren A, NP  Active   Insulin  Pen Needle (PEN NEEDLES) 32G X 5 MM MISC [id number]  1 Needle by Does not apply route daily. Webb, Padonda B, FNP  Active   ipratropium-albuterol (DUONEB) 0.5-2.5 (3) MG/3ML nebulizer solution 3 mL [id number]   Katheen Roselie Rockford, NP  Active   losartan  (COZAAR ) 25 MG tablet [id number] Yes TAKE 1 TABLET (25 MG TOTAL) BY MOUTH DAILY. McElwee, Lauren A, NP  Active   omeprazole  (PRILOSEC) 40 MG capsule [id number] Yes Take 1 capsule (40 mg total) by mouth daily. McElwee, Lauren A, NP  Active   polyethylene glycol powder  (GLYCOLAX/MIRALAX) 17 GM/SCOOP powder [id number] Yes Take 17 g by mouth daily as needed. Dissolve 1 capful (17g) in 4-8 ounces of liquid and take by mouth daily as needed McElwee, Lauren A, NP  Active   rosuvastatin  (CRESTOR ) 40 MG tablet [id number] Yes Take 1 tablet (40 mg total) by mouth daily. McElwee, Lauren A, NP  Active   tirzepatide  (MOUNJARO ) 5 MG/0.5ML Pen [id number] Yes Inject 5 mg into the skin once a week. McElwee, Lauren A, NP  Active   traZODone  (DESYREL ) 100 MG tablet [id number] Yes TAKE 1 TABLET BY MOUTH EVERYDAY AT BEDTIME McElwee, Lauren A, NP  Active   VITAMIN D  PO [id number] Yes Take by mouth daily. [provider]  Active           Recommendation:   Specialty provider follow-up 05/09/24 endocrinology; orthopedic provider 05/17/24 and orthopedic provider 05/17/24 Continue Current Plan of Care  Follow Up Plan:   Telephone follow up appointment date/time:  05/31/24  Heddy Shutter, RN, MSN, BSN, CCM [place]  [facility], Population Health Case Manager Phone: [phone]

## 2024-05-01 NOTE — Patient Instructions (Signed)
 Visit Information  Thank you for taking time to visit with me today. Please don't hesitate to contact me if I can be of assistance to you before our next scheduled appointment.  Your next care management appointment is by telephone on 05/31/24 at 9:00am   Please call the care guide team at [phone] if you need to cancel, schedule, or reschedule an appointment.   Please call the Suicide and Crisis Lifeline: 988 call the [place]  National Suicide Prevention Lifeline: 3018628046 or TTY: [phone] TTY 7043438624) to talk to a trained counselor if you are experiencing a Mental Health or Behavioral Health Crisis or need someone to talk to.  Heddy Shutter, RN, MSN, BSN, CCM   [facility], Population Health Case Manager Phone: [phone]

## 2024-05-03 ENCOUNTER — Other Ambulatory Visit: Payer: Self-pay | Admitting: Nurse Practitioner

## 2024-05-03 ENCOUNTER — Telehealth: Payer: Self-pay

## 2024-05-03 DIAGNOSIS — R051 Acute cough: Secondary | ICD-10-CM

## 2024-05-03 DIAGNOSIS — E1140 Type 2 diabetes mellitus with diabetic neuropathy, unspecified: Secondary | ICD-10-CM

## 2024-05-03 DIAGNOSIS — J069 Acute upper respiratory infection, unspecified: Secondary | ICD-10-CM

## 2024-05-03 NOTE — Telephone Encounter (Signed)
 Noted
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
## 2024-05-03 NOTE — Telephone Encounter (Signed)
 Called patient and informed her that I am calling because out office has received a refill request for the albuterol  inhaler prescribed by Roselie Mood, NP from 2 weeks ago. Patient stated that she does not need the inhaler refilled at this time. I thanked her for taking my call and informed her that I will be refusing the refill request.
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
## 2024-05-04 NOTE — Telephone Encounter (Signed)
 Requesting: GABAPENTIN  300 MG CAPSULE , GLIPIZIDE  ER 10 MG TABLET , TRAZODONE  100 MG TABLET , ROSUVASTATIN  CALCIUM  40 MG TAB  Last Visit: 02/16/2024 Next Visit: 05/17/2024 Last Refill: 03/26/2024, 11/29/23, 03/26/2024, 04/04/2024  Please Advise

## 2024-05-17 ENCOUNTER — Other Ambulatory Visit (INDEPENDENT_AMBULATORY_CARE_PROVIDER_SITE_OTHER)

## 2024-05-17 ENCOUNTER — Ambulatory Visit: Admitting: Nurse Practitioner

## 2024-05-17 ENCOUNTER — Ambulatory Visit: Admitting: Physician Assistant

## 2024-05-17 ENCOUNTER — Encounter: Payer: Self-pay | Admitting: Nurse Practitioner

## 2024-05-17 ENCOUNTER — Other Ambulatory Visit: Payer: Self-pay

## 2024-05-17 ENCOUNTER — Ambulatory Visit: Payer: Self-pay | Admitting: Nurse Practitioner

## 2024-05-17 VITALS — BP 128/82 | HR 82 | Temp 97.1°F | Ht 66.0 in | Wt 193.8 lb

## 2024-05-17 DIAGNOSIS — E1142 Type 2 diabetes mellitus with diabetic polyneuropathy: Secondary | ICD-10-CM | POA: Diagnosis not present

## 2024-05-17 DIAGNOSIS — M7918 Myalgia, other site: Secondary | ICD-10-CM

## 2024-05-17 DIAGNOSIS — I10 Essential (primary) hypertension: Secondary | ICD-10-CM | POA: Diagnosis not present

## 2024-05-17 DIAGNOSIS — E1140 Type 2 diabetes mellitus with diabetic neuropathy, unspecified: Secondary | ICD-10-CM

## 2024-05-17 DIAGNOSIS — M1712 Unilateral primary osteoarthritis, left knee: Secondary | ICD-10-CM | POA: Diagnosis not present

## 2024-05-17 DIAGNOSIS — M65319 Trigger thumb, unspecified thumb: Secondary | ICD-10-CM

## 2024-05-17 DIAGNOSIS — Z794 Long term (current) use of insulin: Secondary | ICD-10-CM

## 2024-05-17 DIAGNOSIS — E782 Mixed hyperlipidemia: Secondary | ICD-10-CM | POA: Diagnosis not present

## 2024-05-17 DIAGNOSIS — F419 Anxiety disorder, unspecified: Secondary | ICD-10-CM

## 2024-05-17 DIAGNOSIS — Z7985 Long-term (current) use of injectable non-insulin antidiabetic drugs: Secondary | ICD-10-CM

## 2024-05-17 LAB — COMPREHENSIVE METABOLIC PANEL WITH GFR
ALT: 26 U/L (ref 0–35)
AST: 19 U/L (ref 0–37)
Albumin: 4.3 g/dL (ref 3.5–5.2)
Alkaline Phosphatase: 88 U/L (ref 39–117)
BUN: 10 mg/dL (ref 6–23)
CO2: 24 meq/L (ref 19–32)
Calcium: 9.4 mg/dL (ref 8.4–10.5)
Chloride: 105 meq/L (ref 96–112)
Creatinine, Ser: 0.81 mg/dL (ref 0.40–1.20)
GFR: 76.01 mL/min (ref >60.00)
Glucose, Bld: 131 mg/dL — ABNORMAL HIGH (ref 70–99)
Potassium: 4 meq/L (ref 3.5–5.1)
Sodium: 138 meq/L (ref 135–145)
Total Bilirubin: 0.6 mg/dL (ref 0.2–1.2)
Total Protein: 6.7 g/dL (ref 6.0–8.3)

## 2024-05-17 LAB — LIPID PANEL
Cholesterol: 232 mg/dL — ABNORMAL HIGH (ref 0–200)
HDL: 43.4 mg/dL (ref >39.00)
LDL Cholesterol: 127 mg/dL — ABNORMAL HIGH (ref 0–99)
NonHDL: 188.68
Total CHOL/HDL Ratio: 5
Triglycerides: 309 mg/dL — ABNORMAL HIGH (ref 0.0–149.0)
VLDL: 61.8 mg/dL — ABNORMAL HIGH (ref 0.0–40.0)

## 2024-05-17 LAB — CBC WITH DIFFERENTIAL/PLATELET
Basophils Absolute: 0 K/uL (ref 0.0–0.1)
Basophils Relative: 0.7 % (ref 0.0–3.0)
Eosinophils Absolute: 0.3 K/uL (ref 0.0–0.7)
Eosinophils Relative: 5 % (ref 0.0–5.0)
HCT: 44.3 % (ref 36.0–46.0)
Hemoglobin: 15.5 g/dL — ABNORMAL HIGH (ref 12.0–15.0)
Lymphocytes Relative: 36.6 % (ref 12.0–46.0)
Lymphs Abs: 1.9 K/uL (ref 0.7–4.0)
MCHC: 34.9 g/dL (ref 30.0–36.0)
MCV: 91.3 fl (ref 78.0–100.0)
Monocytes Absolute: 0.4 K/uL (ref 0.1–1.0)
Monocytes Relative: 7.2 % (ref 3.0–12.0)
Neutro Abs: 2.6 K/uL (ref 1.4–7.7)
Neutrophils Relative %: 50.5 % (ref 43.0–77.0)
Platelets: 253 K/uL (ref 150.0–400.0)
RBC: 4.85 Mil/uL (ref 3.87–5.11)
RDW: 13.8 % (ref 11.5–15.5)
WBC: 5.2 K/uL (ref 4.0–10.5)

## 2024-05-17 MED ORDER — DEXCOM G7 SENSOR MISC: 3 refills | Status: AC

## 2024-05-17 MED ORDER — BACLOFEN 10 MG PO TABS: 10.0000 mg | ORAL_TABLET | Freq: Three times a day (TID) | ORAL | 0 refills | Status: AC

## 2024-05-17 MED ORDER — FENOFIBRATE 200 MG PO CAPS: 200.0000 mg | ORAL_CAPSULE | Freq: Every day | ORAL | 1 refills | Status: AC

## 2024-05-17 MED ORDER — ROSUVASTATIN CALCIUM 40 MG PO TABS: 40.0000 mg | ORAL_TABLET | Freq: Every day | ORAL | 3 refills | Status: AC

## 2024-05-17 MED ORDER — EZETIMIBE 10 MG PO TABS: 10.0000 mg | ORAL_TABLET | Freq: Every day | ORAL | 3 refills | Status: DC

## 2024-05-17 MED ORDER — DICLOFENAC SODIUM 75 MG PO TBEC: 75.0000 mg | DELAYED_RELEASE_TABLET | Freq: Two times a day (BID) | ORAL | 0 refills | Status: AC

## 2024-05-17 MED ORDER — LOSARTAN POTASSIUM 25 MG PO TABS: 25.0000 mg | ORAL_TABLET | Freq: Every day | ORAL | 1 refills | Status: AC

## 2024-05-17 NOTE — Assessment & Plan Note (Signed)
 Continue mounjaro  to 5 mg weekly.
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
## 2024-05-17 NOTE — Progress Notes (Signed)
 Office Visit Note   Patient: Sara Banks           Date of Birth: Jun 30, 1960           MRN: [id number] Visit Date: 05/17/2024              Requested by: Nedra Tinnie LABOR, NP [address] [place],  [place] [address] PCP: Nedra Tinnie LABOR, NP   Assessment & Plan: Visit Diagnoses:  1. Unilateral primary osteoarthritis, left knee   2. Stenosing tenosynovitis of thumb     Plan: Impression is left knee advanced osteoarthritis and left thumb stenosing tenosynovitis with underlying osteophyte formation and probable cyst.  In regards to the knee, we have discussed cortisone injection versus viscosupplementation injection versus total knee arthroplasty.  In regards to the left thumb, we have discussed trigger finger injection versus surgery.  She would like to first check with her insurance before proceeding with anything at this time in regards to her thumb or knee.  She will follow-up with Dr. Jerri if she becomes interested in surgery.  Follow-Up Instructions: Return if symptoms worsen or fail to improve.   Orders:  Orders Placed This Encounter  Procedures   XR KNEE 3 VIEW LEFT   XR Finger Thumb Left   No orders of the defined types were placed in this encounter.     Procedures: No procedures performed   Clinical Data: No additional findings.   Subjective: Chief Complaint  Patient presents with   Left Knee - Pain   Left Thumb - Pain    HPI patient is a pleasant 63 year old female who comes in today with left knee pain and left thumb pain.  Regards to the left knee, she has had years of pain.  Symptoms are constant but worse with any physical activity as well as applying pressure to the knee.  She does occasionally have pain at night as well.  She has been taking ibuprofen  and muscle relaxers without significant relief.  She does note a cortisone injection years ago.  In regards to the left thumb, she is having pain and swelling primarily to the A1 pulley.  She has  popping but no actual locking.  No history of trigger finger.  She is a diabetic and her last hemoglobin A1c was 7.2 from 8 days ago.  Review of Systems as detailed in HPI.  All others reviewed and are negative.   Objective: Vital Signs: There were no vitals taken for this visit.  Physical Exam well-developed well-nourished female in no acute distress.  Alert and oriented x 3.  Ortho Exam left knee exam: Trace effusion.  Range of motion 0 to 130 degrees.  She has tenderness to medial joint line.  Left thumb exam: Moderately sized nodule over the A1 pulley.  This is tender.  She does not have any reproducible triggering.  She is neurovascularly intact distally.  Specialty Comments:  No specialty comments available.  Imaging: XR KNEE 3 VIEW LEFT Result Date: 05/17/2024 X-rays demonstrate bone-on-bone medial and patellofemoral compartments  XR Finger Thumb Left Result Date: 05/17/2024 Impression is diffuse arthritic changes throughout the left thumb and hand.  She does have an osteophyte at the proximal aspect of the first metacarpal.    PMFS History: Patient Active Problem List   Diagnosis Date Noted   Acquired hypothyroidism 04/12/2024   Cerebrovascular disease 04/12/2024   Chronic obstructive bronchitis (HCC) 04/12/2024   Essential hypertension 04/12/2024   Hyperglycemia due to type 2 diabetes mellitus (  HCC) 04/12/2024   Polyneuropathy due to type 2 diabetes mellitus (HCC) 04/12/2024   Major depressive disorder, single episode, unspecified 04/12/2024   Kidney stone 04/12/2024   Chest pressure 02/16/2024   Tension headache 02/16/2024   Multiple joint pain 11/16/2023   Hyperlipidemia 08/15/2023   Pruritus 08/15/2023   Insomnia 04/05/2023   Palpitations 04/05/2023   Long term current use of oral hypoglycemic drug 12/16/2022   Long-term current use of injectable noninsulin antidiabetic medication 12/16/2022   Type 2 diabetes mellitus with diabetic neuropathy, with long-term  current use of insulin  (HCC) 05/26/2022   Dysphagia 05/26/2022   Anxiety 05/26/2022   History of CVA (cerebrovascular accident) 05/26/2022   Chronic pain of both shoulders 05/26/2022   Chronic pain of left knee 05/26/2022   Aortic atherosclerosis 05/26/2022   Arthralgia of left knee 09/23/2020   Vitamin D  deficiency 07/26/2019   History of TIA (transient ischemic attack) 01/23/2019   Smoking hx 01/23/2019   Laryngopharyngeal reflux (LPR) 01/23/2019   Chronic hepatitis C (HCC) 05/11/2013   Tobacco abuse 05/11/2013   Class 1 obesity in adult 05/11/2013   Low back pain 05/11/2013   Past Medical History:  Diagnosis Date   Acute ischemic stroke of right pons 05/11/2013   Anxiety    Arthritis    Cataract    Chronic pain    Depression    Diabetes mellitus without complication (HCC) 2014   GERD (gastroesophageal reflux disease)    Hepatitis C dx 2014   [facility]   Neuropathy    Stroke (cerebrum) (HCC)     Family History  Problem Relation Age of Onset   Cancer Mother        brain   Lung cancer Mother    Diabetes Mother    Cancer Father        unsure wich kind   Colon polyps Father    Rheum arthritis Neg Hx    Colon cancer Neg Hx    Esophageal cancer Neg Hx     Past Surgical History:  Procedure Laterality Date   BREAST BIOPSY Left 06/30/2022   US  LT BREAST BX W LOC DEV 1ST LESION IMG BX SPEC US  GUIDE 06/30/2022 [facility]MO[facility]   KNEE ARTHROSCOPY W/ ACL RECONSTRUCTION     right knee x 3   TRANSTHORACIC ECHOCARDIOGRAM  06/14/2012   normal   TUBAL LIGATION  1985   Social History   Occupational History   Not on file  Tobacco Use   Smoking status: Every Day    Current packs/day: 0.50    Average packs/day: 0.5 packs/day for 38.0 years (19.0 ttl pk-yrs)    Types: Cigarettes   Smokeless tobacco: Never  Vaping Use   Vaping status: Never Used  Substance and Sexual Activity   Alcohol use: No    Comment: rarely once a year wine   Drug use: No   Sexual  activity: Not Currently    Birth control/protection: Abstinence, None

## 2024-05-17 NOTE — Assessment & Plan Note (Signed)
 Chronic, stable. Restart losartan  25mg  daily for kidney protection.
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
## 2024-05-17 NOTE — Patient Instructions (Addendum)
 It was great to see you!  Your sugars are well controlled   Keep taking your medications   Start taking the duloxetine  in the morning and see if this helps more  We will try to get your pills covered again for the cholesterol (rosuvastain and fenofibrate ) and losartan    Let's follow-up in 3 months, sooner if you have concerns.  If a referral was placed today, you will be contacted for an appointment. Please note that routine referrals can sometimes take up to 3-4 weeks to process. Please call our office if you haven't heard anything after this time frame.  Take care,  Tinnie Harada, NP

## 2024-05-31 ENCOUNTER — Other Ambulatory Visit: Payer: Self-pay

## 2024-05-31 DIAGNOSIS — F419 Anxiety disorder, unspecified: Secondary | ICD-10-CM
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
## 2024-05-31 NOTE — Patient Instructions (Signed)
 Visit Information  Thank you for taking time to visit with me today. Please don't hesitate to contact me if I can be of assistance to you before our next scheduled appointment.  Your next care management appointment is by telephone on 07/02/24 at 10:00 am   Please call the care guide team at [phone] if you need to cancel, schedule, or reschedule an appointment.   Please call the Suicide and Crisis Lifeline: 988 call the [place]  National Suicide Prevention Lifeline: (314) 562-2239 or TTY: 727-500-6438 TTY 706-155-4956) to talk to a trained counselor if you are experiencing a Mental Health or Behavioral Health Crisis or need someone to talk to.  Heddy Shutter, RN, MSN, BSN, CCM [place]  [facility], Population Health Case Manager Phone: [phone]

## 2024-06-11 ENCOUNTER — Telehealth: Payer: Self-pay | Admitting: Licensed Clinical Social Worker

## 2024-06-11 NOTE — Patient Outreach (Signed)
 LCSW called patient at appointment time. Patient stated she was at work and unavailable to talk and requested LCSW to call back this afternoon. LCSW will attempt to call back later today.  Cena Ligas, LCSW Clinical Social Worker [facility] Population Health

## 2024-06-18 ENCOUNTER — Telehealth: Payer: Self-pay

## 2024-06-18 NOTE — Progress Notes (Signed)
 Complex Care Management Care Guide Note  06/18/2024 Name: [name] MRN: [id number] DOB: 08-Apr-1961  Sara JONETTA Belardo is a 64 y.o. year old female who is a primary care patient of McElwee, Lauren A, NP and is actively engaged with the care management team. I reached out to Sara Banks by phone today to assist with re-scheduling  with the Licensed Clinical Child Psychotherapist.  Follow up plan: Telephone appointment with complex care management team member scheduled for:  06/20/24 at 1:00 p.m.   Dreama [facility]  [facility], [facility]BCI Assistant Direct Dial: [phone]  Fax: [phone]

## 2024-06-18 NOTE — Progress Notes (Unsigned)
" ° °  06/19/24  Patient ID: Sara Banks, female   DOB: [birth year], 64 y.o.   MRN: [id number]  Unsuccessful outreach for scheduled telephone visit.  I was able to leave HIPAA compliant voicemail with my direct number, and I am sending a MyChart message to attempt to reschedule.  Channing DELENA Mealing, PharmD, DPLA   "

## 2024-06-19 ENCOUNTER — Other Ambulatory Visit: Payer: Self-pay

## 2024-06-20 ENCOUNTER — Telehealth: Payer: Self-pay | Admitting: Licensed Clinical Social Worker

## 2024-06-20 ENCOUNTER — Encounter: Payer: Self-pay | Admitting: Licensed Clinical Social Worker

## 2024-06-20 NOTE — Patient Instructions (Signed)
 Sara Banks - I am sorry I was unable to reach you today for our scheduled appointment. I work with Nedra Tinnie LABOR, NP and am calling to support your healthcare needs. Please contact me at [phone] at your earliest convenience. I look forward to speaking with you soon.   Thank you,  Cena Ligas, LCSW Clinical Social Worker [facility] Population Health

## 2024-06-26 ENCOUNTER — Other Ambulatory Visit: Payer: Self-pay | Admitting: Nurse Practitioner

## 2024-06-26 NOTE — Telephone Encounter (Signed)
 Requesting: MOUNJARO  5 MG/0.5 ML PEN  Last Visit: 05/17/2024 Next Visit: Visit date not found Last Refill: 03/26/2024  Please Advise

## 2024-06-27 ENCOUNTER — Telehealth: Payer: Self-pay | Admitting: Licensed Clinical Social Worker

## 2024-06-27 ENCOUNTER — Encounter: Payer: Self-pay | Admitting: Licensed Clinical Social Worker

## 2024-06-27 NOTE — Patient Instructions (Signed)
 Sara Banks - I am sorry I was unable to reach you today for our scheduled appointment. I work with Nedra Tinnie LABOR, NP and am calling to support your healthcare needs. Please contact me at [phone] at your earliest convenience. I look forward to speaking with you soon.   Thank you,  Cena Ligas, LCSW Clinical Social Worker [facility] Population Health

## 2024-07-02 ENCOUNTER — Other Ambulatory Visit: Payer: Self-pay

## 2024-07-02 NOTE — Patient Instructions (Signed)
 Visit Information  Thank you for taking time to visit with me today. Please don't hesitate to contact me if I can be of assistance to you before our next scheduled appointment.  Your next care management appointment is by telephone on 08/02/24 at 9:30 am   Please call the care guide team at [phone] if you need to cancel, schedule, or reschedule an appointment.   Please  if you are experiencing a Mental Health or Behavioral Health Crisis or need someone to talk to.  Heddy Shutter, RN, MSN, BSN, CCM [place]  [facility], Population Health Case Manager Phone: [phone]
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
## 2024-07-04 LAB — LIPID PANEL
Chol/HDL Ratio: 2.1 ratio (ref 0.0–4.4)
Cholesterol, Total: 109 mg/dL (ref 100–199)
HDL: 51 mg/dL
LDL Chol Calc (NIH): 30 mg/dL (ref 0–99)
Triglycerides: 171 mg/dL — ABNORMAL HIGH (ref 0–149)
VLDL Cholesterol Cal: 28 mg/dL (ref 5–40)

## 2024-08-02 ENCOUNTER — Telehealth
